# Patient Record
Sex: Male | Born: 1960 | Race: White | Hispanic: No | Marital: Married | Smoking: Never smoker
Health system: Southern US, Community
[De-identification: ages and names within clinical notes are randomized; demographics above are authoritative.]

## PROBLEM LIST (undated history)

## (undated) DIAGNOSIS — I480 Paroxysmal atrial fibrillation: Secondary | ICD-10-CM

## (undated) DIAGNOSIS — E785 Hyperlipidemia, unspecified: Secondary | ICD-10-CM

## (undated) DIAGNOSIS — I4892 Unspecified atrial flutter: Secondary | ICD-10-CM

## (undated) DIAGNOSIS — J309 Allergic rhinitis, unspecified: Secondary | ICD-10-CM

## (undated) DIAGNOSIS — R55 Syncope and collapse: Secondary | ICD-10-CM

## (undated) DIAGNOSIS — M1990 Unspecified osteoarthritis, unspecified site: Secondary | ICD-10-CM

## (undated) DIAGNOSIS — I493 Ventricular premature depolarization: Secondary | ICD-10-CM

## (undated) DIAGNOSIS — I519 Heart disease, unspecified: Secondary | ICD-10-CM

## (undated) DIAGNOSIS — G4730 Sleep apnea, unspecified: Secondary | ICD-10-CM

## (undated) DIAGNOSIS — K649 Unspecified hemorrhoids: Secondary | ICD-10-CM

## (undated) DIAGNOSIS — G510 Bell's palsy: Secondary | ICD-10-CM

## (undated) DIAGNOSIS — N400 Enlarged prostate without lower urinary tract symptoms: Secondary | ICD-10-CM

## (undated) HISTORY — DX: Syncope and collapse: R55

## (undated) HISTORY — DX: Benign prostatic hyperplasia without lower urinary tract symptoms: N40.0

## (undated) HISTORY — DX: Hyperlipidemia, unspecified: E78.5

## (undated) HISTORY — DX: Unspecified atrial flutter: I48.92

## (undated) HISTORY — DX: Allergic rhinitis, unspecified: J30.9

## (undated) HISTORY — DX: Ventricular premature depolarization: I49.3

## (undated) HISTORY — DX: Unspecified hemorrhoids: K64.9

## (undated) HISTORY — DX: Bell's palsy: G51.0

---
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
## 1997-03-20 HISTORY — PX: KNEE SURGERY: SHX244

## 1999-04-28 ENCOUNTER — Encounter: Admission: RE | Admit: 1999-04-28 | Discharge: 1999-04-28 | Payer: Self-pay | Admitting: Family Medicine

## 1999-04-28 ENCOUNTER — Encounter: Payer: Self-pay | Admitting: Family Medicine

## 1999-06-21 ENCOUNTER — Encounter: Payer: Self-pay | Admitting: Orthopedic Surgery

## 1999-06-21 ENCOUNTER — Encounter: Admission: RE | Admit: 1999-06-21 | Discharge: 1999-06-21 | Payer: Self-pay | Admitting: Orthopedic Surgery

## 2005-03-20 HISTORY — PX: NM RENAL LASIX (ARMC HX): HXRAD1213

## 2013-09-02 ENCOUNTER — Other Ambulatory Visit: Payer: Self-pay | Admitting: Urology

## 2013-09-02 DIAGNOSIS — R972 Elevated prostate specific antigen [PSA]: Secondary | ICD-10-CM

## 2013-09-17 ENCOUNTER — Ambulatory Visit (HOSPITAL_COMMUNITY)
Admission: RE | Admit: 2013-09-17 | Discharge: 2013-09-17 | Disposition: A | Discharge status: Home / Self Care | Payer: Managed Care, Other (non HMO) | Source: Ambulatory Visit | Attending: Urology | Admitting: Urology

## 2013-09-17 DIAGNOSIS — R972 Elevated prostate specific antigen [PSA]: Secondary | ICD-10-CM | POA: Insufficient documentation

## 2013-09-17 DIAGNOSIS — N400 Enlarged prostate without lower urinary tract symptoms: Secondary | ICD-10-CM | POA: Insufficient documentation

## 2013-09-17 DIAGNOSIS — K4090 Unilateral inguinal hernia, without obstruction or gangrene, not specified as recurrent: Secondary | ICD-10-CM | POA: Insufficient documentation

## 2013-09-17 MED ORDER — GADOBENATE DIMEGLUMINE 529 MG/ML IV SOLN
20.0000 mL | Freq: Once | INTRAVENOUS | Status: AC | PRN
  Administered 2013-09-17: 20 mL via INTRAVENOUS

## 2016-01-01 DIAGNOSIS — N400 Enlarged prostate without lower urinary tract symptoms: Secondary | ICD-10-CM

## 2016-01-01 DIAGNOSIS — R55 Syncope and collapse: Secondary | ICD-10-CM | POA: Insufficient documentation

## 2016-01-01 DIAGNOSIS — E785 Hyperlipidemia, unspecified: Secondary | ICD-10-CM

## 2016-01-01 HISTORY — DX: Benign prostatic hyperplasia without lower urinary tract symptoms: N40.0

## 2016-01-01 HISTORY — DX: Hyperlipidemia, unspecified: E78.5

## 2016-01-02 DIAGNOSIS — I4892 Unspecified atrial flutter: Secondary | ICD-10-CM

## 2016-01-02 DIAGNOSIS — I493 Ventricular premature depolarization: Secondary | ICD-10-CM

## 2016-01-02 HISTORY — DX: Unspecified atrial flutter: I48.92

## 2016-01-02 HISTORY — DX: Ventricular premature depolarization: I49.3

## 2016-01-20 ENCOUNTER — Encounter: Payer: Self-pay | Admitting: *Deleted

## 2016-01-20 ENCOUNTER — Other Ambulatory Visit: Payer: Self-pay | Admitting: *Deleted

## 2016-01-20 DIAGNOSIS — K649 Unspecified hemorrhoids: Secondary | ICD-10-CM

## 2016-01-20 DIAGNOSIS — E7800 Pure hypercholesterolemia, unspecified: Secondary | ICD-10-CM | POA: Insufficient documentation

## 2016-01-20 DIAGNOSIS — J309 Allergic rhinitis, unspecified: Secondary | ICD-10-CM | POA: Insufficient documentation

## 2016-01-20 HISTORY — DX: Unspecified hemorrhoids: K64.9

## 2016-01-20 HISTORY — DX: Allergic rhinitis, unspecified: J30.9

## 2016-02-04 ENCOUNTER — Institutional Professional Consult (permissible substitution): Payer: Managed Care, Other (non HMO) | Admitting: Internal Medicine

## 2016-02-04 ENCOUNTER — Ambulatory Visit (INDEPENDENT_AMBULATORY_CARE_PROVIDER_SITE_OTHER): Payer: Managed Care, Other (non HMO) | Admitting: Internal Medicine

## 2016-02-04 ENCOUNTER — Encounter: Payer: Self-pay | Admitting: Internal Medicine

## 2016-02-04 VITALS — BP 112/80 | HR 77 | Ht 77.0 in | Wt 235.0 lb

## 2016-02-04 DIAGNOSIS — Z7689 Persons encountering health services in other specified circumstances: Secondary | ICD-10-CM

## 2016-02-04 MED ORDER — FLECAINIDE ACETATE 100 MG PO TABS: 100.0000 mg | ORAL_TABLET | Freq: Two times a day (BID) | ORAL | 3 refills | Status: DC

## 2016-02-04 NOTE — Patient Instructions (Addendum)
Medication Instructions:  Your physician has recommended you make the following change in your medication:  1) START Flecainide 100 mg twice a day   Labwork: None Ordered   Testing/Procedures: EKG nurse visit in 10 days   Follow-Up: Your physician recommends that you schedule a follow-up appointment in: 1st week in January 2018 with Dr. Ladona Ridgel    Any Other Special Instructions Will Be Listed Below (If Applicable).     If you need a refill on your cardiac medications before your next appointment, please call your pharmacy.

## 2016-02-04 NOTE — Progress Notes (Signed)
HPI [name] is referred today by Dr. Jacinto Thompson for evaluation of atrial flutter. He is a pleasant middle aged man who has been healthy. He felt a little dizzy and sob when visiting his family in [place] and was taken to the ER and found to have atrial flutter. He was seen by Dr. Jacinto Thompson and had a normal echo and stress test. He does not feel palpitations per say but does note that when he walks uphill for several hundred yards gets sob with exertion. No syncope.  Allergies  Allergen Reactions  . Atorvastatin     Other reaction(s): weakness     Current Outpatient Prescriptions  Medication Sig Dispense Refill  . aspirin EC 81 MG tablet Take 81 mg by mouth daily.    . Cholecalciferol (D3 SUPER STRENGTH) 2000 units CAPS Take 2,000 Units by mouth daily.    . finasteride (PROSCAR) 5 MG tablet Take 5 mg by mouth daily.    . hydrocortisone-pramoxine (PROCTOFOAM HC) rectal foam Place 1 application rectally as needed.    . metoprolol tartrate (LOPRESSOR) 25 MG tablet Take 25 mg by mouth 2 (two) times daily.  1  . simvastatin (ZOCOR) 40 MG tablet Take 40 mg by mouth daily at 6 PM.     . vitamin E 400 UNIT capsule Take 400 Units by mouth daily.    . flecainide (TAMBOCOR) 100 MG tablet Take 1 tablet (100 mg total) by mouth 2 (two) times daily. 60 tablet 3   No current facility-administered medications for this visit.      Past Medical History:  Diagnosis Date  . Allergic rhinitis 01/20/2016  . Bell's palsy   . BPH (benign prostatic hyperplasia) 01/01/2016  . Dyslipidemia (high LDL; low HDL) 01/01/2016  . 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 Hemorrhoids 01/20/2016  . Irregular heartbeat   . Near syncope 01/01/2016  . Paroxysmal atrial flutter (HCC) 01/02/2016  . Pure hypercholesterolemia 01/20/2016    ROS:   All systems reviewed and negative except as noted in the HPI.   Past Surgical History:  Procedure Laterality Date  . APPENDECTOMY  1973  . KNEE SURGERY Right 1999     Family  History  Problem Relation Age of Onset  . CAD Father     quad bypass @ 6568  . Healthy Sister      Social History   Social History  . Marital status: Married    Spouse name: N/A  . Number of children: 1  . Years of education: N/A   Occupational History  . Not on file.   Social History Main Topics  . Smoking status: Never Smoker  . Smokeless tobacco: Never Used  . Alcohol use Yes     Comment: occasional  . Drug use: Unknown  . Sexual activity: Not on file   Other Topics Concern  . Not on file   Social History Narrative  . No narrative on file     BP 112/80   Pulse 77   Ht 6\' 5"  (1.956 m)   Wt 235 lb (106.6 kg)   SpO2 98%   BMI 27.87 kg/m   Physical Exam:  Well appearing NAD HEENT: Unremarkable Neck:  No JVD, no thyromegally Lymphatics:  No adenopathy Back:  No CVA tenderness Lungs:  Clear with no wheezes HEART:  Regular rate rhythm, no murmurs, no rubs, no clicks Abd:  soft, positive bowel sounds, no organomegally, no rebound, no guarding Ext:  2 plus pulses, no edema, no cyanosis,  no clubbing Skin:  No rashes no nodules Neuro:  CN II through XII intact, motor grossly intact  EKG - atrial fib and NSR on same ECG  Assess/Plan: 1. PAF - he has atrial fib in our office and reported to have atrial flutter (I do not have these tracings). I have discussed the treatment options from rate control to rhythm control. He would like to try rhythm control. Will start flecainide 100 mg bid. He will continue his beta blocker and return in 2 weeks for a repeat ECG to look at the QRS duration. If he is ok we will see him again and plan a stress test. 2. Dyslipidemia - continue statin therapy 3. Pericarditis - he notes that he had this about 40 years ago but none since.  Fred Thompson.D.

## 2016-04-04 ENCOUNTER — Ambulatory Visit (INDEPENDENT_AMBULATORY_CARE_PROVIDER_SITE_OTHER): Payer: Managed Care, Other (non HMO) | Admitting: Internal Medicine

## 2016-04-04 ENCOUNTER — Encounter: Payer: Self-pay | Admitting: Internal Medicine

## 2016-04-04 ENCOUNTER — Encounter (INDEPENDENT_AMBULATORY_CARE_PROVIDER_SITE_OTHER): Payer: Self-pay

## 2016-04-04 VITALS — BP 110/70 | HR 65 | Ht 77.0 in | Wt 236.0 lb

## 2016-04-04 DIAGNOSIS — I480 Paroxysmal atrial fibrillation: Secondary | ICD-10-CM

## 2016-04-04 NOTE — Progress Notes (Signed)
HPI [name] returns today for ongoing followup of atrial fib and flutter. He was seen by me 2 months ago and was placed on flecainide. He went back to rhythm spontaneously. He feels well except for a little fatigue on metoprolol. He does not have palpitations. No syncope. He can walk without limitation. Allergies  Allergen Reactions  . Atorvastatin     Other reaction(s): weakness     Current Outpatient Prescriptions  Medication Sig Dispense Refill  . aspirin EC 81 MG tablet Take 81 mg by mouth daily.    . Cholecalciferol (D3 SUPER STRENGTH) 2000 units CAPS Take 2,000 Units by mouth daily.    . finasteride (PROSCAR) 5 MG tablet Take 5 mg by mouth daily.    . flecainide (TAMBOCOR) 100 MG tablet Take 1 tablet (100 mg total) by mouth 2 (two) times daily. 60 tablet 3  . hydrocortisone-pramoxine (PROCTOFOAM HC) rectal foam Place 1 application rectally as needed.    . metoprolol tartrate (LOPRESSOR) 25 MG tablet Take 12.5 mg by mouth 2 (two) times daily.   1  . simvastatin (ZOCOR) 40 MG tablet Take 40 mg by mouth daily at 6 PM.     . vitamin E 400 UNIT capsule Take 400 Units by mouth daily.     No current facility-administered medications for this visit.      Past Medical History:  Diagnosis Date  . Allergic rhinitis 01/20/2016  . Bell's palsy   . BPH (benign prostatic hyperplasia) 01/01/2016  . Dyslipidemia (high LDL; low HDL) 01/01/2016  . 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 Hemorrhoids 01/20/2016  . Irregular heartbeat   . Near syncope 01/01/2016  . Paroxysmal atrial flutter (HCC) 01/02/2016  . Pure hypercholesterolemia 01/20/2016    ROS:   All systems reviewed and negative except as noted in the HPI.   Past Surgical History:  Procedure Laterality Date  . APPENDECTOMY  1973  . KNEE SURGERY Right 1999     Family History  Problem Relation Age of Onset  . CAD Father     quad bypass @ 12  . Healthy Sister      Social History   Social History  . Marital status:  Married    Spouse name: N/A  . Number of children: 1  . Years of education: N/A   Occupational History  . Not on file.   Social History Main Topics  . Smoking status: Never Smoker  . Smokeless tobacco: Never Used  . Alcohol use Yes     Comment: occasional  . Drug use: Unknown  . Sexual activity: Not on file   Other Topics Concern  . Not on file   Social History Narrative  . No narrative on file     BP 110/70   Pulse 65   Ht 6\' 5"  (1.956 m)   Wt 236 lb (107 kg)   SpO2 95%   BMI 27.99 kg/m   Physical Exam:  Well appearing middle aged man, NAD HEENT: Unremarkable Neck:  6 cm JVD, no thyromegally Lymphatics:  No adenopathy Back:  No CVA tenderness Lungs:  Clear with no wheezes HEART:  Regular rate rhythm, no murmurs, no rubs, no clicks Abd:  soft, positive bowel sounds, no organomegally, no rebound, no guarding Ext:  2 plus pulses, no edema, no cyanosis, no clubbing Skin:  No rashes no nodules Neuro:  CN II through XII intact, motor grossly intact  EKG - nsr with pvcs  Assess/Plan: 1. Atrial fib/flutter - He  is maintaining NSR. He will continue the flecainide and low dose beta blocker. Will have Dr. Jacinto Halim obtain an exercise stress test on flecainide. 2. PVC's - he will continue low dose flecainide. 3. Mild LV dysfunction - his echo EF was obtained in atrial fib. I would suggest repeating this in 6-12 months. Continue beta blocker. I'll defer the need to ARB/ACE to Dr. Jacinto Halim.  Leonia Reeves.D.

## 2016-04-04 NOTE — Patient Instructions (Addendum)
Medication Instructions:    Your physician recommends that you continue on your current medications as directed. Please refer to the Current Medication list given to you today.  --- If you need a refill on your cardiac medications before your next appointment, please call your pharmacy. ---  Labwork:  None ordered  Testing/Procedures: None ordered  Follow-Up:  Your physician wants you to follow-up in: 6 months with Dr. Ladona Ridgel.  You will receive a reminder letter in the mail two months in advance. If you don't receive a letter, please call our office to schedule the follow-up appointment.  Thank you for choosing [facility]!!     Any Other Special Instructions Will Be Listed Below (If Applicable).  Dr. Ladona Ridgel will get in touch with Dr. Verl Dicker office to arrange treadmill testing.  This is to evaluate your hear while on Flecainide.

## 2016-04-13 ENCOUNTER — Telehealth: Payer: Self-pay | Admitting: Internal Medicine

## 2016-04-13 NOTE — Telephone Encounter (Signed)
New Message   Patient has a appt Feb. 26 with Dr. Gillermina Hu.. Per pt was told by Dr. Ladona Ridgel that he would reach out to Dr. Gillermina Hu and request getting stress test done while he was there. Pt requesting call back.

## 2016-04-25 ENCOUNTER — Telehealth: Payer: Self-pay | Admitting: Internal Medicine

## 2016-04-25 DIAGNOSIS — Z79899 Other long term (current) drug therapy: Secondary | ICD-10-CM

## 2016-04-25 NOTE — Telephone Encounter (Signed)
New Message     Dr Jacinto Halim wants a referral from Dr Ladona Ridgel for pt to have Stress test before 05/15/16

## 2016-04-28 NOTE — Telephone Encounter (Signed)
Called, spoke with pt. Informed pt is scheduled for a GXT at our office on 05/15/16 at 10:30 AM, arriving at 10:15. Reviewed pt instructions for stress test. Pt needs to HOLD Metoprolol the morning prior to the test. Take all other medications. Pt verbalized understanding.

## 2016-05-15 ENCOUNTER — Ambulatory Visit (INDEPENDENT_AMBULATORY_CARE_PROVIDER_SITE_OTHER): Payer: Managed Care, Other (non HMO)

## 2016-05-15 ENCOUNTER — Other Ambulatory Visit: Payer: Self-pay | Admitting: Internal Medicine

## 2016-05-15 DIAGNOSIS — Z5181 Encounter for therapeutic drug level monitoring: Secondary | ICD-10-CM

## 2016-05-15 DIAGNOSIS — Z79899 Other long term (current) drug therapy: Secondary | ICD-10-CM

## 2016-05-15 DIAGNOSIS — I493 Ventricular premature depolarization: Secondary | ICD-10-CM | POA: Diagnosis not present

## 2016-05-15 DIAGNOSIS — I480 Paroxysmal atrial fibrillation: Secondary | ICD-10-CM | POA: Diagnosis not present

## 2016-05-15 LAB — EXERCISE TOLERANCE TEST
CHL CUP STRESS STAGE 1 DBP: 81 mmHg
CHL CUP STRESS STAGE 1 GRADE: 0 %
CHL CUP STRESS STAGE 1 HR: 74 {beats}/min
CHL CUP STRESS STAGE 1 SBP: 118 mmHg
CHL CUP STRESS STAGE 1 SPEED: 0 mph
CHL CUP STRESS STAGE 10 DBP: 71 mmHg
CHL CUP STRESS STAGE 10 GRADE: 0 %
CHL CUP STRESS STAGE 10 SBP: 125 mmHg
CHL CUP STRESS STAGE 10 SPEED: 0 mph
CHL CUP STRESS STAGE 2 HR: 74 {beats}/min
CHL CUP STRESS STAGE 3 HR: 72 {beats}/min
CHL CUP STRESS STAGE 4 SBP: 140 mmHg
CHL CUP STRESS STAGE 4 SPEED: 1.7 mph
CHL CUP STRESS STAGE 5 GRADE: 12 %
CHL CUP STRESS STAGE 5 HR: 112 {beats}/min
CHL CUP STRESS STAGE 6 DBP: 82 mmHg
CHL CUP STRESS STAGE 6 GRADE: 14 %
CHL CUP STRESS STAGE 6 HR: 133 {beats}/min
CHL CUP STRESS STAGE 6 SBP: 209 mmHg
CHL CUP STRESS STAGE 6 SPEED: 3.4 mph
CHL CUP STRESS STAGE 7 GRADE: 16 %
CHL CUP STRESS STAGE 9 GRADE: 0 %
CHL CUP STRESS STAGE 9 HR: 105 {beats}/min
Estimated workload: 10.5 METS
Exercise duration (min): 9 min
Exercise duration (sec): 15 s
MPHR: 163 {beats}/min
Peak HR: 134 {beats}/min
Percent HR: 82 %
Percent of predicted max HR: 82 %
RPE: 19
Rest HR: 68 {beats}/min
Stage 10 HR: 79 {beats}/min
Stage 2 Grade: 0 %
Stage 2 Speed: 1 mph
Stage 3 Grade: 0.1 %
Stage 3 Speed: 1 mph
Stage 4 DBP: 79 mmHg
Stage 4 Grade: 10 %
Stage 4 HR: 95 {beats}/min
Stage 5 DBP: 80 mmHg
Stage 5 SBP: 165 mmHg
Stage 5 Speed: 2.5 mph
Stage 7 HR: 134 {beats}/min
Stage 7 Speed: 4.2 mph
Stage 8 DBP: 58 mmHg
Stage 8 Grade: 0 %
Stage 8 HR: 116 {beats}/min
Stage 8 SBP: 135 mmHg
Stage 8 Speed: 1.5 mph
Stage 9 DBP: 58 mmHg
Stage 9 SBP: 135 mmHg
Stage 9 Speed: 1.5 mph

## 2016-05-24 ENCOUNTER — Telehealth: Payer: Self-pay

## 2016-05-24 NOTE — Telephone Encounter (Signed)
Rec'd faxed med list from Dr. Verl Dicker office. Diltiazem CD 180 mg cap daily (d/c'd Metoprolol Succinate). Flecainide 75 mg twice daily (med decreased) . Lipitor 20 mg (switched from Simvastin due to interaction with Diltiazem). Will forward to Dr. Ladona Ridgel to advise.

## 2016-05-24 NOTE — Telephone Encounter (Signed)
Called, spoke with pt. Reviewed results of recent stress test. Dr. Ladona Ridgel stated informed low risk stress test and for pt to continue with current medications. Pt informed Dr. Jacinto Halim made medication changes yesterday. Pt stated he is unsure of med changes (because he does not have his med bottles with him). Pt thinks his Flecainide was decreased to 75 mg twice daily. Pt stated Metoprolol was d/c'd. Informed I will contact Dr. Verl Dicker office to get current med list faxed. I will forward med changes to Dr. Ladona Ridgel to review. Pt verbalized understanding.

## 2016-05-29 ENCOUNTER — Other Ambulatory Visit: Payer: Self-pay | Admitting: Internal Medicine

## 2016-08-09 ENCOUNTER — Ambulatory Visit (INDEPENDENT_AMBULATORY_CARE_PROVIDER_SITE_OTHER): Payer: Managed Care, Other (non HMO) | Admitting: Neurology

## 2016-08-09 ENCOUNTER — Encounter (INDEPENDENT_AMBULATORY_CARE_PROVIDER_SITE_OTHER): Payer: Self-pay

## 2016-08-09 ENCOUNTER — Encounter: Payer: Self-pay | Admitting: Neurology

## 2016-08-09 VITALS — BP 146/90 | HR 86 | Resp 20 | Ht 77.0 in | Wt 239.0 lb

## 2016-08-09 DIAGNOSIS — T466X5A Adverse effect of antihyperlipidemic and antiarteriosclerotic drugs, initial encounter: Secondary | ICD-10-CM

## 2016-08-09 DIAGNOSIS — M791 Myalgia, unspecified site: Secondary | ICD-10-CM

## 2016-08-09 DIAGNOSIS — R0683 Snoring: Secondary | ICD-10-CM | POA: Diagnosis not present

## 2016-08-09 DIAGNOSIS — I5032 Chronic diastolic (congestive) heart failure: Secondary | ICD-10-CM

## 2016-08-09 DIAGNOSIS — I4892 Unspecified atrial flutter: Secondary | ICD-10-CM

## 2016-08-09 NOTE — Progress Notes (Signed)
SLEEP MEDICINE CLINIC   Provider:  Melvyn Novas, [place] D  Primary Care Physician:  [name], MD [facility].    Referring Provider: Dr Jacinto Halim.  Chief Complaint  Patient presents with  . New Patient (Initial Visit)    snoring, never had a sleep study    HPI:  Fred Thompson is a 56 y.o. male , seen here as in a referral  from Dr. Jacinto Halim to evaluate if  OSA could be present in this patient with atrial fib/ flutter.    I have the pleasure of seeing [name] today on 08/09/2016, referred by his cardiologist with an underlying diagnosis of atrial fibrillation and atrial flutter.  The patient reports snoring, rhinitis and has not been known to have apnea.  The patient has a past medical history of Bell's palsy on the left, had frequent PVCs, an echocardiogram from 01/18/2016 showed normal left ventricle size mild decrease in systolic function and mild global hypokinesis. Ejection fraction is 45-50% with a normal diastolic filling pattern the left atrium is mildly dilated. He has a trace of mitral regurgitation and a trace of tricuspid regurgitation as well. Paroxysmal atrial flutter was diagnosed in October 2017 and followed up with an exercise tolerance stress test on 05/15/2016, he achieved only 82% of PMH. At the time of the exercise tolerance stress test the patient had been taken generic Lipitor and he developed myositis, aching and was unable to exercise. He states that he was even unable to shrug his shoulders or walk a flight of stairs. There were  PVCs bigemini's-  with stress he had been clearly in atrial fibrillation in November 2017, when he was seen by Dr. Sharrell Ku. He has an incomplete right bundle branch block. Paroxysmal episodes of atrial flutter with variable atrioventricular conduction and rapid ventricular response with a diagnosis. The follow-up stress test was done as a nuclear study and reportedly normal.    Chief complaint according to patient : The  patient reported that he feels tired and fatigued, and attributes this mainly to the heart medication rather than the irregular heart rhythm. He also has nocturia up to 3 times at night.  Sleep habits are as follows: The patient goes to bed by 9:30 PM, his wife prefers to watch the TV on but he turns the volume down. It seems not to affect his sleep that the TV is in the room. The bedroom is otherwise cool and quiet. He sleeps on his side. He sleeps on one pillow only. He uses a flat mattress top. His sleep will be interrupted 2-3 times at night by the urge to urinate. He rises at 5:00 AM. He has an over 1 hour commute every morning to [place], [place]. He denies any sleep choking, shortness of breath, diaphoresis or palpitations. He wakes without headaches. He feels usually not ready to start his day and not restored or refreshed but has always done so. In total he will gets about 6- 7 hours of sleep at night.  Sleep medical history and family sleep history:  No family History of OSA, he was a sleep walker.   Social history: The patient is married with a 51 year old daughter, 2 grandchildren. Lives [place]. He commutes long distance. Not a shift worker. He has no history of tobacco use, he drinks less than 3 alcoholic beverages per week. He does not drink coffee tea and he does not drink soft drinks. No caffeine intake. 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  Review of Systems: Out of a complete 14 system review, the patient complains of only the following symptoms, and all other reviewed systems are negative. How likely are you to doze in the following situations: 0 = not likely, 1 = slight chance, 2 = moderate chance, 3 = high chance  Sitting and Reading? 2 Watching Television?2 Sitting inactive in a public place (theater or meeting)?2 As a passenger in a car for an hour without a break?2  Lying down in the afternoon when circumstances permit?3 Sitting and talking to someone?1 Sitting  quietly after lunch without alcohol?2 In a car, while stopped for a few minutes in traffic?1  Total =15    Epworth score 15- , Fatigue severity score 47  , depression score 1/ 15   Social History   Social History  . Marital status: Married    Spouse name: N/A  . Number of children: 1  . Years of education: N/A   Occupational History  . Not on file.   Social History Main Topics  . Smoking status: Never Smoker  . Smokeless tobacco: Never Used  . Alcohol use Yes     Comment: occasional  . Drug use: Unknown  . Sexual activity: Not on file   Other Topics Concern  . Not on file   Social History Narrative  . No narrative on file    Family History  Problem Relation Age of Onset  . CAD Father        quad bypass @ 49  . Healthy Sister     Past Medical History:  Diagnosis Date  . Allergic rhinitis 01/20/2016  . Bell's palsy   . BPH (benign prostatic hyperplasia) 01/01/2016  . Dyslipidemia (high LDL; low HDL) 01/01/2016  . 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 Hemorrhoids 01/20/2016  . Irregular heartbeat   . Near syncope 01/01/2016  . Paroxysmal atrial flutter (HCC) 01/02/2016  . Pure hypercholesterolemia 01/20/2016    Past Surgical History:  Procedure Laterality Date  . APPENDECTOMY  1973  . KNEE SURGERY Right 1999    Current Outpatient Prescriptions  Medication Sig Dispense Refill  . aspirin EC 81 MG tablet Take 81 mg by mouth daily.    . Cholecalciferol (D3 SUPER STRENGTH) 2000 units CAPS Take 2,000 Units by mouth daily.    Marland Kitchen diltiazem (DILACOR XR) 180 MG 24 hr capsule Take 180 mg by mouth daily.    . finasteride (PROSCAR) 5 MG tablet Take 5 mg by mouth daily.    . flecainide (TAMBOCOR) 150 MG tablet Take 75 mg by mouth 2 (two) times daily.  1  . hydrocortisone-pramoxine (PROCTOFOAM HC) rectal foam Place 1 application rectally as needed.    . vitamin E 400 UNIT capsule Take 400 Units by mouth daily.     No current facility-administered medications for this visit.       Allergies as of 08/09/2016 - Review Complete 08/09/2016  Allergen Reaction Noted  . Atorvastatin  07/12/2010    Vitals: BP (!) 146/90   Pulse 86   Resp 20   Ht 6\' 5"  (1.956 m)   Wt 239 lb (108.4 kg)   BMI 28.34 kg/m  Last Weight:  Wt Readings from Last 1 Encounters:  08/09/16 239 lb (108.4 kg)   JYN:WGNF mass index is 28.34 kg/m.     Last Height:   Ht Readings from Last 1 Encounters:  08/09/16 6\' 5"  (1.956 m)    Physical exam:  General: The patient is awake, alert and appears not in  acute distress. The patient is well groomed. Head: Normocephalic, atraumatic. Neck is supple. Mallampati 4,  neck circumference:17.5 . Nasal airflow rhinitis. TMJ is  Not evident . Retrognathia is seen.  Cardiovascular:  Regular rate and rhythm, without  murmurs or carotid bruit, and without distended neck veins. Respiratory: Lungs are clear to auscultation. Skin:  Without evidence of edema, or rash Trunk: BMI is normal . The patient's posture is erect   Neurologic exam : The patient is awake and alert, oriented to place and time.   Memory subjective  described as intact.  Attention span & concentration ability appears normal.  Speech is fluent,  without dysarthria, dysphonia or aphasia.  Mood and affect are appropriate.  Cranial nerves: Pupils are equal and briskly reactive to light. Funduscopic exam without evidence of pallor or edema.  Extraocular movements  in vertical and horizontal planes intact and without nystagmus. Visual fields by finger perimetry are intact. Hearing to finger rub intact.   Facial sensation intact to fine touch.  Facial motor strength is asymmetric, with a mild left Bell's residue - but  tongue and uvula move midline. Shoulder shrug was symmetrical.   Motor exam:   Normal tone, muscle bulk and symmetric strength in all extremities. Sensory:  normal. Coordination: Finger-to-nose maneuver  normal without evidence of ataxia, dysmetria or tremor.  Gait and  station: Patient walks without assistive device and is able unassisted to climb up to the exam table. Strength within normal limits.  Stance is stable and normal.  Turns with 3 Steps.   Deep tendon reflexes: in the  upper and lower extremities are symmetric and intact.   Assessment:    Dear Dr. Jacinto Halim,   After physical and neurologic examination, review of laboratory studies,   testing and pre-existing records as far as provided in visit., my assessment is   1) Mr. Konicek cardiac condition and irregular heartbeat with rapid ventricular responses may correlate to the presence of sleep apnea, also unbeknownst to him. I will order a split night polysomnography in the lab, and parallel order a home sleep test as well. I am less suspicious that the patient may suffer from hypoxemia, as is neither morbidly obese nor does he have normal muscle tone. He also does not report waking up with morning headaches.  The patient was advised of the nature of the diagnosed disorder , the treatment options and the  risks for general health and wellness arising from not treating the condition.   I spent more than 45 minutes of face to face time with the patient.  Greater than 50% of time was spent in counseling and coordination of care. We have discussed the diagnosis and differential and I answered the patient's questions.    Plan:  Treatment plan and additional workup : [employer] HST , we will meet after the home sleep test has been interpreted, and discuss further treatment options either by phone or in person on revisit.    Melvyn Novas, MD 08/09/2016, 9:20 AM  Certified in Neurology by ABPN Certified in Sleep Medicine by [facility] [address], [address] [place], [place] [address]

## 2016-08-28 ENCOUNTER — Ambulatory Visit (INDEPENDENT_AMBULATORY_CARE_PROVIDER_SITE_OTHER): Payer: Managed Care, Other (non HMO) | Admitting: Cardiology

## 2016-08-28 ENCOUNTER — Other Ambulatory Visit: Payer: Self-pay | Admitting: Cardiology

## 2016-08-28 ENCOUNTER — Encounter: Payer: Self-pay | Admitting: Cardiology

## 2016-08-28 ENCOUNTER — Encounter: Payer: Self-pay | Admitting: *Deleted

## 2016-08-28 VITALS — BP 130/76 | HR 77 | Ht 77.0 in | Wt 237.8 lb

## 2016-08-28 DIAGNOSIS — I480 Paroxysmal atrial fibrillation: Secondary | ICD-10-CM | POA: Diagnosis not present

## 2016-08-28 DIAGNOSIS — I428 Other cardiomyopathies: Secondary | ICD-10-CM

## 2016-08-28 DIAGNOSIS — I493 Ventricular premature depolarization: Secondary | ICD-10-CM | POA: Diagnosis not present

## 2016-08-28 NOTE — Patient Instructions (Addendum)
Medication Instructions:    Your physician recommends that you continue on your current medications as directed. Please refer to the Current Medication list given to you today.  - If you need a refill on your cardiac medications before your next appointment, please call your pharmacy.   Labwork:  None ordered  Testing/Procedures:  Your physician has requested that you have cardiac CT - you will have this performed within 7 days prior to ablation. Cardiac computed tomography (CT) is a painless test that uses an x-ray machine to take clear, detailed pictures of your heart. For further information please visit [email or web address]   The office will call you to arrange this test, after we have pre-certified with  insurance.  Your physician has recommended that you have an ablation. Catheter ablation is a medical procedure used to treat some cardiac arrhythmias (irregular heartbeats). During catheter ablation, a long, thin, flexible tube is put into a blood vessel in your groin (upper thigh), or neck. This tube is called an ablation catheter. It is then guided to your heart through the blood vessel. Radio frequency waves destroy small areas of heart tissue where abnormal heartbeats may cause an arrhythmia to start. Please see the instruction sheet given to you today.  Follow-Up:  Your physician recommends that you schedule a follow-up appointment in: 4 weeks, after your procedure on 09/29/16, with Fred Thompson in the AFib clinic.   Your physician recommends that you schedule a follow-up appointment in: 3 months, after your procedure on 09/29/16, with Fred Thompson.  Thank you for choosing [facility]!!   Dory Horn, RN [phone]  Any Other Special Instructions Will Be Listed Below (If Applicable).   Cardiac Ablation Cardiac ablation is a procedure to disable (ablate) a small amount of heart tissue in very specific places. The heart has many electrical connections. Sometimes these  connections are abnormal and can cause the heart to beat very fast or irregularly. Ablating some of the problem areas can improve the heart rhythm or return it to normal. Ablation may be done for people who:  Have Wolff-Parkinson-White syndrome.  Have fast heart rhythms (tachycardia).  Have taken medicines for an abnormal heart rhythm (arrhythmia) that were not effective or caused side effects.  Have a high-risk heartbeat that may be life-threatening.  During the procedure, a small incision is made in the neck or the groin, and a long, thin, flexible tube (catheter) is inserted into the incision and moved to the heart. Small devices (electrodes) on the tip of the catheter will send out electrical currents. A type of X-ray (fluoroscopy) will be used to help guide the catheter and to provide images of the heart. Tell a health care provider about:  Any allergies you have.  All medicines you are taking, including vitamins, herbs, eye drops, creams, and over-the-counter medicines.  Any problems you or family members have had with anesthetic medicines.  Any blood disorders you have.  Any surgeries you have had.  Any medical conditions you have, such as kidney failure.  Whether you are pregnant or may be pregnant. What are the risks? Generally, this is a safe procedure. However, problems may occur, including:  Infection.  Bruising and bleeding at the catheter insertion site.  Bleeding into the chest, especially into the sac that surrounds the heart. This is a serious complication.  Stroke or blood clots.  Damage to other structures or organs.  Allergic reaction to medicines or dyes.  Need for a permanent pacemaker if the normal electrical system  is damaged. A pacemaker is a small computer that sends electrical signals to the heart and helps your heart beat normally.  The procedure not being fully effective. This may not be recognized until months later. Repeat ablation procedures  are sometimes required.  What happens before the procedure?  Follow instructions from your health care provider about eating or drinking restrictions.  Ask your health care provider about: ? Changing or stopping your regular medicines. This is especially important if you are taking diabetes medicines or blood thinners. ? Taking medicines such as aspirin and ibuprofen. These medicines can thin your blood. Do not take these medicines before your procedure if your health care provider instructs you not to.  Plan to have someone take you home from the hospital or clinic.  If you will be going home right after the procedure, plan to have someone with you for 24 hours. What happens during the procedure?  To lower your risk of infection: ? Your health care team will wash or sanitize their hands. ? Your skin will be washed with soap. ? Hair may be removed from the incision area.  An IV tube will be inserted into one of your veins.  You will be given a medicine to help you relax (sedative).  The skin on your neck or groin will be numbed.  An incision will be made in your neck or your groin.  A needle will be inserted through the incision and into a large vein in your neck or groin.  A catheter will be inserted into the needle and moved to your heart.  Dye may be injected through the catheter to help your surgeon see the area of the heart that needs treatment.  Electrical currents will be sent from the catheter to ablate heart tissue in desired areas. There are three types of energy that may be used to ablate heart tissue: ? Heat (radiofrequency energy). ? Laser energy. ? Extreme cold (cryoablation).  When the necessary tissue has been ablated, the catheter will be removed.  Pressure will be held on the catheter insertion area to prevent excessive bleeding.  A bandage (dressing) will be placed over the catheter insertion area. The procedure may vary among health care providers and  hospitals. What happens after the procedure?  Your blood pressure, heart rate, breathing rate, and blood oxygen level will be monitored until the medicines you were given have worn off.  Your catheter insertion area will be monitored for bleeding. You will need to lie still for a few hours to ensure that you do not bleed from the catheter insertion area.  Do not drive for 24 hours or as long as directed by your health care provider. Summary  Cardiac ablation is a procedure to disable (ablate) a small amount of heart tissue in very specific places. Ablating some of the problem areas can improve the heart rhythm or return it to normal.  During the procedure, electrical currents will be sent from the catheter to ablate heart tissue in desired areas. This information is not intended to replace advice given to you by your health care provider. Make sure you discuss any questions you have with your health care provider. Document Released: 07/23/2008 Document Revised: 01/24/2016 Document Reviewed: 01/24/2016 Elsevier Interactive Patient Education  [employer].

## 2016-08-28 NOTE — Progress Notes (Signed)
Electrophysiology Office Note   Date:  08/28/2016   ID:  CHEE KINSLOW, DOB [birth year], MRN [id number]  PCP:  [name], MD  Cardiologist:  Colan Neptune Primary Electrophysiologist:  Breda Bond Jorja Loa, MD    Chief Complaint  Patient presents with  . Follow-up    PAF/Flutter, discuss Afib ablation     History of Present Illness: Fred Thompson is a 56 y.o. male who is being seen today for the evaluation of atrial fibrillation/flutter at the request of [name], [name], MD. Presenting today for electrophysiology evaluation.  He was initially diagnosed with atrial fibrillation in October 2017. Initial symptoms were dizziness, diaphoresis, or syncope. He was a fundraiser time taking in the hospital was diagnosed. He was since placed on flecainide, and did not tolerate this medication due to fatigue. I Rowe Warman switch to multi, and he continues to have pain amounts of fatigue. He has had nuclear stress tests, which apparently been normal, and an echo showed an EF of 45-50%. He does continue to have episodes of fatigue. This past weekend, he did his granddaughter for lunch, and had to return home due to extreme amounts of fatigue.    Today, he denies symptoms of palpitations, chest pain, shortness of breath, orthopnea, PND, lower extremity edema, claudication, dizziness, presyncope, syncope, bleeding, or neurologic sequela. The patient is tolerating medications without difficulties. His only complaint today is of fatigue.   Past Medical History:  Diagnosis Date  . Allergic rhinitis 01/20/2016  . Bell's palsy   . BPH (benign prostatic hyperplasia) 01/01/2016  . Dyslipidemia (high LDL; low HDL) 01/01/2016  . 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 Hemorrhoids 01/20/2016  . Irregular heartbeat   . Near syncope 01/01/2016  . Paroxysmal atrial flutter (HCC) 01/02/2016  . Pure hypercholesterolemia 01/20/2016   Past Surgical History:  Procedure Laterality Date  . APPENDECTOMY  1973  . KNEE  SURGERY Right 1999     Current Outpatient Prescriptions  Medication Sig Dispense Refill  . Cholecalciferol (D3 SUPER STRENGTH) 2000 units CAPS Take 2,000 Units by mouth daily.    Marland Kitchen diltiazem (DILACOR XR) 180 MG 24 hr capsule Take 180 mg by mouth daily.    . finasteride (PROSCAR) 5 MG tablet Take 5 mg by mouth daily.    . hydrocortisone-pramoxine (PROCTOFOAM HC) rectal foam Place 1 application rectally as needed.    . MULTAQ 400 MG tablet Take 400 mg by mouth 2 (two) times daily.  2  . vitamin E 400 UNIT capsule Take 400 Units by mouth daily.    Carlena Hurl 20 MG TABS tablet Take 20 mg by mouth daily.  3   No current facility-administered medications for this visit.     Allergies:   Atorvastatin   Social History:  The patient  reports that he has never smoked. He has never used smokeless tobacco. He reports that he drinks alcohol.   Family History:  The patient's family history includes CAD in his father; Healthy in his sister.    ROS:  Please see the history of present illness.   Otherwise, review of systems is positive for fatigue, palpitations, DOE.   All other systems are reviewed and negative.    PHYSICAL EXAM: VS:  BP 130/76   Pulse 77   Ht 6\' 5"  (1.956 m)   Wt 237 lb 12.8 oz (107.9 kg)   BMI 28.20 kg/m  , BMI Body mass index is 28.2 kg/m. GEN: Well nourished, well developed, in no acute distress  HEENT: normal  Neck: no JVD, carotid bruits, or masses Cardiac: RRR; no murmurs, rubs, or gallops,no edema  Respiratory:  clear to auscultation bilaterally, normal work of breathing GI: soft, nontender, nondistended, + BS MS: no deformity or atrophy  Skin: warm and dry Neuro:  Strength and sensation are intact Psych: euthymic mood, full affect  EKG:  EKG is ordered today. Personal review of the ekg ordered shows sinus rhythm, rate 77  Recent Labs: No results found for requested labs within last 8760 hours.    Lipid Panel  No results found for: CHOL, TRIG, HDL,  CHOLHDL, VLDL, LDLCALC, LDLDIRECT   Wt Readings from Last 3 Encounters:  08/28/16 237 lb 12.8 oz (107.9 kg)  08/09/16 239 lb (108.4 kg)  04/04/16 236 lb (107 kg)      Other studies Reviewed: Additional studies/ records that were reviewed today include: ETT 05/15/16  Review of the above records today demonstrates:    Blood pressure demonstrated a hypertensive response to exercise. 1 mm upsloping ST depression in inferior leads with exercise Non diagnostic due to achieving onlyh 82% of PMHR PVC;s and bigemminy with stress HTN response to exercise   TTE 01/17/17 Mild global hypokinesis, EF 45-50%, mildly dilated left atrium 4.1 cm  ASSESSMENT AND PLAN:  1.  Atrial fibrillation/flutter: Currently on Multaq. He is complaining of fatigue with multi, and has not tolerated flecainide in the past. Discussed continuing medical management versus ablation. Risks and benefits of ablation were discussed. Risks include bleeding, tamponade, heart block, stroke, and damage to surrounding organs. He understands these risks and has agreed to the procedure. We Karinna Beadles continue his Xarelto until 3 months post procedure.  This patients CHA2DS2-VASc Score and unadjusted Ischemic Stroke Rate (% per year) is equal to 0.6 % stroke rate/year from a score of 1  Above score calculated as 1 point each if present [CHF, HTN, DM, Vascular=MI/PAD/Aortic Plaque, Age if 65-74, or Male] Above score calculated as 2 points each if present [Age > 75, or Stroke/TIA/TE]  2. PVCs: No PVCs on his EKG today. Continue current management.  3. Mild LV dysfunction: We'll defer adding ACE inhibitor/ARB to Dr. Jacinto Halim. Appears euvolemic on exam.  Current medicines are reviewed at length with the patient today.   The patient does not have concerns regarding his medicines.  The following changes were made today:  none  Labs/ tests ordered today include:  Orders Placed This Encounter  Procedures  . EKG 12-Lead      Disposition:   FU with Sandee Bernath 4 month  Signed, Eliya Bubar Jorja Loa, MD  08/28/2016 9:10 AM     [facility] [address] [place] [address] [phone] (office) [phone] (fax)

## 2016-08-28 NOTE — Addendum Note (Signed)
Addended by: Baird Lyons on: 08/28/2016 09:17 AM   Modules accepted: Orders

## 2016-09-12 ENCOUNTER — Encounter: Payer: Self-pay | Admitting: Cardiology

## 2016-09-13 ENCOUNTER — Ambulatory Visit (INDEPENDENT_AMBULATORY_CARE_PROVIDER_SITE_OTHER): Payer: Managed Care, Other (non HMO) | Admitting: Neurology

## 2016-09-13 DIAGNOSIS — I4892 Unspecified atrial flutter: Secondary | ICD-10-CM

## 2016-09-13 DIAGNOSIS — G4710 Hypersomnia, unspecified: Secondary | ICD-10-CM | POA: Diagnosis not present

## 2016-09-13 DIAGNOSIS — T466X5A Adverse effect of antihyperlipidemic and antiarteriosclerotic drugs, initial encounter: Secondary | ICD-10-CM

## 2016-09-13 DIAGNOSIS — M791 Myalgia, unspecified site: Secondary | ICD-10-CM

## 2016-09-13 DIAGNOSIS — G4737 Central sleep apnea in conditions classified elsewhere: Secondary | ICD-10-CM

## 2016-09-13 DIAGNOSIS — R0683 Snoring: Secondary | ICD-10-CM

## 2016-09-22 ENCOUNTER — Ambulatory Visit (HOSPITAL_COMMUNITY)
Admission: RE | Admit: 2016-09-22 | Discharge: 2016-09-22 | Disposition: A | Discharge status: Home / Self Care | Payer: Managed Care, Other (non HMO) | Source: Ambulatory Visit | Attending: Cardiology | Admitting: Cardiology

## 2016-09-22 ENCOUNTER — Ambulatory Visit (HOSPITAL_COMMUNITY): Admission: RE | Admit: 2016-09-22 | Payer: Managed Care, Other (non HMO) | Source: Ambulatory Visit

## 2016-09-22 DIAGNOSIS — I480 Paroxysmal atrial fibrillation: Secondary | ICD-10-CM | POA: Diagnosis not present

## 2016-09-22 DIAGNOSIS — I700 Atherosclerosis of aorta: Secondary | ICD-10-CM | POA: Diagnosis not present

## 2016-09-22 DIAGNOSIS — R079 Chest pain, unspecified: Secondary | ICD-10-CM | POA: Diagnosis not present

## 2016-09-22 DIAGNOSIS — I2510 Atherosclerotic heart disease of native coronary artery without angina pectoris: Secondary | ICD-10-CM | POA: Diagnosis not present

## 2016-09-22 DIAGNOSIS — K449 Diaphragmatic hernia without obstruction or gangrene: Secondary | ICD-10-CM | POA: Insufficient documentation

## 2016-09-22 MED ORDER — METOPROLOL TARTRATE 5 MG/5ML IV SOLN
5.0000 mg | INTRAVENOUS | Status: DC | PRN
  Administered 2016-09-22: 5 mg via INTRAVENOUS
  Filled 2016-09-22: qty 5

## 2016-09-22 MED ORDER — METOPROLOL TARTRATE 5 MG/5ML IV SOLN
INTRAVENOUS | Status: AC
  Filled 2016-09-22: qty 15

## 2016-09-22 MED ORDER — NITROGLYCERIN 0.4 MG SL SUBL
0.4000 mg | SUBLINGUAL_TABLET | SUBLINGUAL | Status: DC | PRN
  Administered 2016-09-22: 0.4 mg via SUBLINGUAL
  Filled 2016-09-22: qty 25

## 2016-09-22 MED ORDER — IOPAMIDOL (ISOVUE-370) INJECTION 76%
INTRAVENOUS | Status: DC
Start: 2016-09-22 — End: 2016-09-23
  Filled 2016-09-22: qty 100

## 2016-09-22 MED ORDER — NITROGLYCERIN 0.4 MG SL SUBL
SUBLINGUAL_TABLET | SUBLINGUAL | Status: AC
  Filled 2016-09-22: qty 1

## 2016-09-22 MED ORDER — IOPAMIDOL (ISOVUE-370) INJECTION 76%
INTRAVENOUS | Status: AC
  Administered 2016-09-22: 80 mL
  Filled 2016-09-22: qty 100

## 2016-09-22 MED ORDER — IOPAMIDOL (ISOVUE-370) INJECTION 76%
INTRAVENOUS | Status: AC
  Filled 2016-09-22: qty 50

## 2016-09-22 MED ORDER — IOPAMIDOL (ISOVUE-370) INJECTION 76%
INTRAVENOUS | Status: AC
  Filled 2016-09-22: qty 100

## 2016-09-22 MED ORDER — METOPROLOL TARTRATE 5 MG/5ML IV SOLN
INTRAVENOUS | Status: AC
  Filled 2016-09-22: qty 10

## 2016-09-26 ENCOUNTER — Other Ambulatory Visit: Payer: Self-pay | Admitting: Neurology

## 2016-09-26 DIAGNOSIS — I480 Paroxysmal atrial fibrillation: Secondary | ICD-10-CM

## 2016-09-26 DIAGNOSIS — G4719 Other hypersomnia: Secondary | ICD-10-CM

## 2016-09-26 DIAGNOSIS — I509 Heart failure, unspecified: Secondary | ICD-10-CM

## 2016-09-26 NOTE — Procedures (Signed)
NAME: Fred Thompson   DOB: [birth year] MEDICAL RECORD [id number]   DOS: 09/18/16      REFERRING PHYSICIAN: Yates Decamp, MD STUDY PERFORMED: HST HISTORY: [name] is a 56 year old male, seen here to evaluate if OSA could be present in this patient with atrial fib/ flutter. The patient reported that he feels tired and fatigued, and attributes this mainly to the heart medication rather than the irregular heart rhythm. He also has nocturia- up to 3 times at night. He denies any sleep choking, shortness of breath, diaphoresis or palpitations. He wakes without headaches, will get about 6- 7 hours of sleep at night. Epworth score 15/ 24 points, Fatigue severity score 47 points, depression score 01/ 15    STUDY RESULTS: Total Recording Time: 7h 13m Total Apnea/Hypopnea Index (AHI): 5.6 /hr. and RDI was 9.2/ hr. Average Oxygen Saturation: SpO2 93%. Lowest Oxygen Saturation: nadir SpO2 89%, total desaturation time was only 1 minute. Average Heart Rate: 63 bpm, between 40 and 107 bpm.   IMPRESSION: Mild sleep apnea. Moderate snoring with arousals (RDI). No associated hypoxemia.  RECOMMENDATION: CPAP treatment is optional for such a mild degree of apnea and lack of hypoxia. I recommend attended CPAP titration due to the finding of central apnea and variable heart rate , and due to the listed comorbidities (atrial fibrillation) and excessive daytime sleepiness.  I certify that I have reviewed the raw data recording prior to the issuance of this report in accordance with the standards of the American Academy of Sleep Medicine (AASM). Melvyn Novas, MD   09-26-2016  [facility]. Medical Director Diplomat, ABPN, ABSM.  Member of and accredited by AASM

## 2016-09-26 NOTE — Addendum Note (Signed)
Addended by: Melvyn Novas on: 09/26/2016 05:48 PM   Modules accepted: Orders

## 2016-09-28 ENCOUNTER — Telehealth: Payer: Self-pay | Admitting: Cardiology

## 2016-09-28 NOTE — Telephone Encounter (Signed)
New message    Pt is calling asking for a call back from RN. He has questions about his procedure scheduled for tomorrow.

## 2016-09-28 NOTE — Telephone Encounter (Signed)
Called patient back and went over instructions for his procedure tomorrow. Patient verbalized understanding.

## 2016-09-29 ENCOUNTER — Ambulatory Visit (HOSPITAL_COMMUNITY)
Admission: RE | Admit: 2016-09-29 | Discharge: 2016-09-30 | Disposition: A | Discharge status: Home / Self Care | Payer: Managed Care, Other (non HMO) | Source: Ambulatory Visit | Attending: Cardiology | Admitting: Cardiology

## 2016-09-29 ENCOUNTER — Encounter (HOSPITAL_COMMUNITY): Payer: Self-pay | Admitting: Certified Registered Nurse Anesthetist

## 2016-09-29 ENCOUNTER — Ambulatory Visit (HOSPITAL_COMMUNITY): Payer: Managed Care, Other (non HMO) | Admitting: Anesthesiology

## 2016-09-29 ENCOUNTER — Other Ambulatory Visit: Payer: Self-pay

## 2016-09-29 ENCOUNTER — Encounter (HOSPITAL_COMMUNITY)
Admission: RE | Disposition: A | Discharge status: Home / Self Care | Payer: Self-pay | Source: Ambulatory Visit | Attending: Cardiology

## 2016-09-29 DIAGNOSIS — E7800 Pure hypercholesterolemia, unspecified: Secondary | ICD-10-CM | POA: Diagnosis not present

## 2016-09-29 DIAGNOSIS — K449 Diaphragmatic hernia without obstruction or gangrene: Secondary | ICD-10-CM | POA: Insufficient documentation

## 2016-09-29 DIAGNOSIS — I2510 Atherosclerotic heart disease of native coronary artery without angina pectoris: Secondary | ICD-10-CM | POA: Diagnosis not present

## 2016-09-29 DIAGNOSIS — I480 Paroxysmal atrial fibrillation: Secondary | ICD-10-CM

## 2016-09-29 DIAGNOSIS — D72819 Decreased white blood cell count, unspecified: Secondary | ICD-10-CM

## 2016-09-29 DIAGNOSIS — I4891 Unspecified atrial fibrillation: Secondary | ICD-10-CM | POA: Diagnosis present

## 2016-09-29 DIAGNOSIS — I483 Typical atrial flutter: Secondary | ICD-10-CM

## 2016-09-29 DIAGNOSIS — I493 Ventricular premature depolarization: Secondary | ICD-10-CM | POA: Diagnosis present

## 2016-09-29 DIAGNOSIS — I4892 Unspecified atrial flutter: Secondary | ICD-10-CM | POA: Diagnosis present

## 2016-09-29 DIAGNOSIS — I700 Atherosclerosis of aorta: Secondary | ICD-10-CM | POA: Insufficient documentation

## 2016-09-29 DIAGNOSIS — E785 Hyperlipidemia, unspecified: Secondary | ICD-10-CM | POA: Diagnosis present

## 2016-09-29 DIAGNOSIS — N400 Enlarged prostate without lower urinary tract symptoms: Secondary | ICD-10-CM | POA: Diagnosis not present

## 2016-09-29 DIAGNOSIS — Z7901 Long term (current) use of anticoagulants: Secondary | ICD-10-CM | POA: Insufficient documentation

## 2016-09-29 DIAGNOSIS — D649 Anemia, unspecified: Secondary | ICD-10-CM | POA: Diagnosis not present

## 2016-09-29 HISTORY — DX: Heart disease, unspecified: I51.9

## 2016-09-29 HISTORY — PX: ATRIAL FIBRILLATION ABLATION: EP1191

## 2016-09-29 HISTORY — DX: Paroxysmal atrial fibrillation: I48.0

## 2016-09-29 LAB — BASIC METABOLIC PANEL
Anion gap: 6 (ref 5–15)
BUN: 10 mg/dL (ref 6–20)
CHLORIDE: 107 mmol/L (ref 101–111)
CO2: 24 mmol/L (ref 22–32)
CREATININE: 1 mg/dL (ref 0.61–1.24)
Calcium: 8.7 mg/dL — ABNORMAL LOW (ref 8.9–10.3)
Glucose, Bld: 98 mg/dL (ref 65–99)
POTASSIUM: 3.8 mmol/L (ref 3.5–5.1)
SODIUM: 137 mmol/L (ref 135–145)

## 2016-09-29 LAB — CBC
HCT: 37.2 % — ABNORMAL LOW (ref 39.0–52.0)
HEMOGLOBIN: 12.2 g/dL — AB (ref 13.0–17.0)
MCH: 27.7 pg (ref 26.0–34.0)
MCHC: 32.8 g/dL (ref 30.0–36.0)
MCV: 84.5 fL (ref 78.0–100.0)
PLATELETS: 187 10*3/uL (ref 150–400)
RBC: 4.4 MIL/uL (ref 4.22–5.81)
RDW: 13.2 % (ref 11.5–15.5)
WBC: 3.4 10*3/uL — AB (ref 4.0–10.5)

## 2016-09-29 LAB — POCT ACTIVATED CLOTTING TIME
ACTIVATED CLOTTING TIME: 252 s
ACTIVATED CLOTTING TIME: 296 s
ACTIVATED CLOTTING TIME: 197 s
Activated Clotting Time: 290 seconds
Activated Clotting Time: 213 seconds

## 2016-09-29 SURGERY — ATRIAL FIBRILLATION ABLATION
Anesthesia: General

## 2016-09-29 MED ORDER — SODIUM CHLORIDE 0.9 % IV SOLN: 250.0000 mL | INTRAVENOUS | Status: DC | PRN

## 2016-09-29 MED ORDER — HEPARIN SODIUM (PORCINE) 1000 UNIT/ML IJ SOLN
INTRAMUSCULAR | Status: DC | PRN
  Administered 2016-09-29: 4000 [IU] via INTRAVENOUS
  Administered 2016-09-29: 7000 [IU] via INTRAVENOUS
  Administered 2016-09-29: 15000 [IU] via INTRAVENOUS
  Administered 2016-09-29: 5000 [IU] via INTRAVENOUS

## 2016-09-29 MED ORDER — FINASTERIDE 5 MG PO TABS
5.0000 mg | ORAL_TABLET | Freq: Every day | ORAL | Status: DC
  Administered 2016-09-29 – 2016-09-30 (×2): 5 mg via ORAL
  Filled 2016-09-29 (×2): qty 1

## 2016-09-29 MED ORDER — LIDOCAINE 2% (20 MG/ML) 5 ML SYRINGE
INTRAMUSCULAR | Status: DC | PRN
  Administered 2016-09-29: 60 mg via INTRAVENOUS

## 2016-09-29 MED ORDER — ACETAMINOPHEN 325 MG PO TABS: 650.0000 mg | ORAL_TABLET | ORAL | Status: DC | PRN

## 2016-09-29 MED ORDER — HEPARIN (PORCINE) IN NACL 2-0.9 UNIT/ML-% IJ SOLN
INTRAMUSCULAR | Status: AC
  Filled 2016-09-29: qty 500

## 2016-09-29 MED ORDER — ONDANSETRON HCL 4 MG/2ML IJ SOLN
INTRAMUSCULAR | Status: DC | PRN
  Administered 2016-09-29: 4 mg via INTRAVENOUS

## 2016-09-29 MED ORDER — PROTAMINE SULFATE 10 MG/ML IV SOLN
INTRAVENOUS | Status: DC | PRN
  Administered 2016-09-29: 40 mg via INTRAVENOUS

## 2016-09-29 MED ORDER — DILTIAZEM HCL ER COATED BEADS 180 MG PO CP24
180.0000 mg | ORAL_CAPSULE | Freq: Every day | ORAL | Status: DC
  Administered 2016-09-29 – 2016-09-30 (×2): 180 mg via ORAL
  Filled 2016-09-29 (×4): qty 1

## 2016-09-29 MED ORDER — SODIUM CHLORIDE 0.9% FLUSH
3.0000 mL | Freq: Two times a day (BID) | INTRAVENOUS | Status: DC
  Administered 2016-09-29 – 2016-09-30 (×2): 3 mL via INTRAVENOUS

## 2016-09-29 MED ORDER — VITAMIN D 1000 UNITS PO TABS
2000.0000 [IU] | ORAL_TABLET | Freq: Every day | ORAL | Status: DC
  Administered 2016-09-29 – 2016-09-30 (×2): 2000 [IU] via ORAL
  Filled 2016-09-29 (×2): qty 2

## 2016-09-29 MED ORDER — BUPIVACAINE HCL (PF) 0.25 % IJ SOLN
INTRAMUSCULAR | Status: DC | PRN
  Administered 2016-09-29: 40 mL

## 2016-09-29 MED ORDER — RIVAROXABAN 20 MG PO TABS
20.0000 mg | ORAL_TABLET | Freq: Every day | ORAL | Status: DC
  Administered 2016-09-29: 20 mg via ORAL
  Filled 2016-09-29: qty 1

## 2016-09-29 MED ORDER — ONDANSETRON HCL 4 MG/2ML IJ SOLN: 4.0000 mg | Freq: Four times a day (QID) | INTRAMUSCULAR | Status: DC | PRN

## 2016-09-29 MED ORDER — HEPARIN SODIUM (PORCINE) 1000 UNIT/ML IJ SOLN
INTRAMUSCULAR | Status: AC
  Filled 2016-09-29: qty 1

## 2016-09-29 MED ORDER — ROCURONIUM BROMIDE 10 MG/ML (PF) SYRINGE
PREFILLED_SYRINGE | INTRAVENOUS | Status: DC | PRN
  Administered 2016-09-29: 20 mg via INTRAVENOUS
  Administered 2016-09-29: 50 mg via INTRAVENOUS

## 2016-09-29 MED ORDER — EPHEDRINE SULFATE-NACL 50-0.9 MG/10ML-% IV SOSY
PREFILLED_SYRINGE | INTRAVENOUS | Status: DC | PRN
  Administered 2016-09-29 (×3): 5 mg via INTRAVENOUS

## 2016-09-29 MED ORDER — VITAMIN E 180 MG (400 UNIT) PO CAPS
400.0000 [IU] | ORAL_CAPSULE | Freq: Every day | ORAL | Status: DC
  Administered 2016-09-30: 400 [IU] via ORAL
  Filled 2016-09-29 (×2): qty 1

## 2016-09-29 MED ORDER — LACTATED RINGERS IV SOLN
INTRAVENOUS | Status: DC | PRN
  Administered 2016-09-29 (×2): via INTRAVENOUS

## 2016-09-29 MED ORDER — BUPIVACAINE HCL (PF) 0.25 % IJ SOLN
INTRAMUSCULAR | Status: AC
  Filled 2016-09-29: qty 30

## 2016-09-29 MED ORDER — MIDAZOLAM HCL 2 MG/2ML IJ SOLN
INTRAMUSCULAR | Status: DC | PRN
  Administered 2016-09-29: 2 mg via INTRAVENOUS

## 2016-09-29 MED ORDER — HEPARIN SODIUM (PORCINE) 1000 UNIT/ML IJ SOLN
INTRAMUSCULAR | Status: DC | PRN
  Administered 2016-09-29: 1000 [IU] via INTRAVENOUS

## 2016-09-29 MED ORDER — PROPOFOL 10 MG/ML IV BOLUS
INTRAVENOUS | Status: DC | PRN
  Administered 2016-09-29: 160 mg via INTRAVENOUS
  Administered 2016-09-29: 40 mg via INTRAVENOUS

## 2016-09-29 MED ORDER — PHENYLEPHRINE 40 MCG/ML (10ML) SYRINGE FOR IV PUSH (FOR BLOOD PRESSURE SUPPORT)
PREFILLED_SYRINGE | INTRAVENOUS | Status: DC | PRN
  Administered 2016-09-29: 120 ug via INTRAVENOUS
  Administered 2016-09-29: 40 ug via INTRAVENOUS
  Administered 2016-09-29: 80 ug via INTRAVENOUS

## 2016-09-29 MED ORDER — LORATADINE 10 MG PO TABS
10.0000 mg | ORAL_TABLET | Freq: Every day | ORAL | Status: DC
  Administered 2016-09-29 – 2016-09-30 (×2): 10 mg via ORAL
  Filled 2016-09-29 (×2): qty 1

## 2016-09-29 MED ORDER — FENTANYL CITRATE (PF) 250 MCG/5ML IJ SOLN
INTRAMUSCULAR | Status: DC | PRN
  Administered 2016-09-29 (×2): 50 ug via INTRAVENOUS

## 2016-09-29 MED ORDER — SODIUM CHLORIDE 0.9% FLUSH: 3.0000 mL | INTRAVENOUS | Status: DC | PRN

## 2016-09-29 MED ORDER — DOBUTAMINE IN D5W 4-5 MG/ML-% IV SOLN
INTRAVENOUS | Status: DC | PRN
  Administered 2016-09-29: 20 ug/kg/min via INTRAVENOUS

## 2016-09-29 MED ORDER — SUGAMMADEX SODIUM 200 MG/2ML IV SOLN
INTRAVENOUS | Status: DC | PRN
  Administered 2016-09-29: 100 mg via INTRAVENOUS

## 2016-09-29 MED ORDER — HEPARIN (PORCINE) IN NACL 2-0.9 UNIT/ML-% IJ SOLN
INTRAMUSCULAR | Status: AC | PRN
  Administered 2016-09-29: 2000 mL

## 2016-09-29 MED ORDER — DOBUTAMINE IN D5W 4-5 MG/ML-% IV SOLN
INTRAVENOUS | Status: AC
  Filled 2016-09-29: qty 250

## 2016-09-29 SURGICAL SUPPLY — 19 items
BAG SNAP BAND KOVER 36X36 (MISCELLANEOUS) ×2 IMPLANT
BLANKET WARM UNDERBOD FULL ACC (MISCELLANEOUS) ×3 IMPLANT
CATH SMTCH THERMOCOOL SF DF (CATHETERS) ×2 IMPLANT
CATH SOUNDSTAR 3D IMAGING (CATHETERS) ×2 IMPLANT
CATH VARIABLE LASSO NAV 2515 (CATHETERS) ×2 IMPLANT
CATH WEBSTER BI DIR CS D-F CRV (CATHETERS) ×2 IMPLANT
COVER SWIFTLINK CONNECTOR (BAG) ×3 IMPLANT
NDL TRANSSEPTAL BRK 98CM (NEEDLE) IMPLANT
NEEDLE TRANSSEPTAL BRK 98CM (NEEDLE) ×3 IMPLANT
PACK EP LATEX FREE (CUSTOM PROCEDURE TRAY) ×3
PACK EP LF (CUSTOM PROCEDURE TRAY) ×1 IMPLANT
PAD DEFIB LIFELINK (PAD) ×3 IMPLANT
PATCH CARTO3 (PAD) ×2 IMPLANT
SHEATH AGILIS NXT 8.5F 71CM (SHEATH) ×4 IMPLANT
SHEATH AVANTI 11F 11CM (SHEATH) ×2 IMPLANT
SHEATH PINNACLE 7F 10CM (SHEATH) ×2 IMPLANT
SHEATH PINNACLE 8F 10CM (SHEATH) ×4 IMPLANT
SHEATH PINNACLE 9F 10CM (SHEATH) ×4 IMPLANT
TUBING SMART ABLATE COOLFLOW (TUBING) ×2 IMPLANT

## 2016-09-29 NOTE — Progress Notes (Signed)
Site area: 2 rt fv sheaths Site Prior to Removal:  Level 0 Pressure Applied For: 20 minutes Manual:   yes Patient Status During Pull:  stable Post Pull Site:  Level 0 Post Pull Instructions Given:  yes Post Pull Pulses Present: palpable Dressing Applied:  Gauze and tegaderm Bedrest begins @ 1630 Comments:

## 2016-09-29 NOTE — Anesthesia Postprocedure Evaluation (Signed)
Anesthesia Post Note  Patient: Fred Thompson  Procedure(s) Performed: Procedure(s) (LRB): Atrial Fibrillation Ablation (N/A)     Patient location during evaluation: PACU Anesthesia Type: General Level of consciousness: awake and alert Pain management: pain level controlled Vital Signs Assessment: post-procedure vital signs reviewed and stable Respiratory status: spontaneous breathing, nonlabored ventilation, respiratory function stable and patient connected to nasal cannula oxygen Cardiovascular status: blood pressure returned to baseline and stable Postop Assessment: no signs of nausea or vomiting Anesthetic complications: no    Last Vitals:  Vitals:   09/29/16 1518 09/29/16 1520  BP:  131/74  Pulse:  90  Resp:  13  Temp: (!) 36.4 C     Last Pain:  Vitals:   09/29/16 1518  TempSrc: Temporal                 Laini Urick

## 2016-09-29 NOTE — Discharge Instructions (Signed)
No driving for 4 days. No lifting over 5 lbs for 1 week. No vigorous or sexual activity for 1 week. You may return to work on 10/06/16. Keep procedure site clean & dry. If you notice increased pain, swelling, bleeding or pus, call/return!  You may shower, but no soaking baths/hot tubs/pools for 1 week.    You have an appointment set up with the Atrial Fibrillation Clinic.  Multiple studies have shown that being followed by a dedicated atrial fibrillation clinic in addition to the standard care you receive from your other physicians improves health. We believe that enrollment in the atrial fibrillation clinic will allow [place] to better care for you.   The phone number to the Atrial Fibrillation Clinic is [phone]. The clinic is staffed Monday through Friday from 8:30am to 5pm.  Parking Directions: The clinic is located in the Heart and Vascular Building connected to [facility]. 1)From [address] turn on to [address] and go to the 3rd entrance  (Heart and Vascular entrance) on the right. 2)Look to the right for Heart &Vascular Parking Garage. 3)The code for the entrance is 7000 for July.   4)Take the elevators to the 1st floor. Registration is in the room with the glass walls at the end of the hallway.  If you have any trouble parking or locating the clinic, please dont hesitate to call [phone].

## 2016-09-29 NOTE — Anesthesia Preprocedure Evaluation (Addendum)
Anesthesia Evaluation  Patient identified by MRN, date of birth, ID band Patient awake    Reviewed: Allergy & Precautions, NPO status , Patient's Chart, lab work & pertinent test results  Airway Mallampati: II  TM Distance: >3 FB Neck ROM: Full    Dental no notable dental hx. (+) Dental Advisory Given, Teeth Intact   Pulmonary neg pulmonary ROS,    Pulmonary exam normal breath sounds clear to auscultation       Cardiovascular negative cardio ROS Normal cardiovascular exam Rhythm:Regular Rate:Normal     Neuro/Psych negative neurological ROS  negative psych ROS   GI/Hepatic negative GI ROS, Neg liver ROS,   Endo/Other  negative endocrine ROS  Renal/GU negative Renal ROS  negative genitourinary   Musculoskeletal negative musculoskeletal ROS (+)   Abdominal   Peds negative pediatric ROS (+)  Hematology negative hematology ROS (+)   Anesthesia Other Findings   Reproductive/Obstetrics negative OB ROS                            Anesthesia Physical Anesthesia Plan  ASA: II  Anesthesia Plan: General   Post-op Pain Management:    Induction: Intravenous  PONV Risk Score and Plan: 2 and Ondansetron, Dexamethasone and Treatment may vary due to age or medical condition  Airway Management Planned: Oral ETT  Additional Equipment:   Intra-op Plan:   Post-operative Plan: Extubation in OR  Informed Consent: I have reviewed the patients History and Physical, chart, labs and discussed the procedure including the risks, benefits and alternatives for the proposed anesthesia with the patient or authorized representative who has indicated his/her understanding and acceptance.   Dental advisory given  Plan Discussed with:   Anesthesia Plan Comments: (  )        Anesthesia Quick Evaluation

## 2016-09-29 NOTE — Transfer of Care (Signed)
Immediate Anesthesia Transfer of Care Note  Patient: Fred Thompson  Procedure(s) Performed: Procedure(s): Atrial Fibrillation Ablation (N/A)  Patient Location: Cath Lab  Anesthesia Type:General  Level of Consciousness: drowsy and patient cooperative  Airway & Oxygen Therapy: Patient Spontanous Breathing and Patient connected to nasal cannula oxygen  Post-op Assessment: Report given to RN, Post -op Vital signs reviewed and stable and Patient moving all extremities X 4  Post vital signs: Reviewed and stable  Last Vitals:  Vitals:   09/29/16 0940  BP: (!) 145/90  Pulse: 84  Temp: 36.6 C    Last Pain:  Vitals:   09/29/16 0940  TempSrc: Oral         Complications: No apparent anesthesia complications

## 2016-09-29 NOTE — Anesthesia Procedure Notes (Signed)
Procedure Name: Intubation Date/Time: 09/29/2016 11:41 AM Performed by: Fred Thompson Pre-anesthesia Checklist: Patient identified, Emergency Drugs available, Suction available and Patient being monitored Patient Re-evaluated:Patient Re-evaluated prior to induction Oxygen Delivery Method: Circle system utilized Preoxygenation: Pre-oxygenation with 100% oxygen Induction Type: IV induction Ventilation: Mask ventilation without difficulty and Two handed mask ventilation required Laryngoscope Size: Mac and 4 Grade View: Grade II Tube type: Oral Tube size: 7.5 mm Number of attempts: 1 Airway Equipment and Method: Stylet Placement Confirmation: ETT inserted through vocal cords under direct vision,  positive ETCO2 and breath sounds checked- equal and bilateral Secured at: 24 cm Tube secured with: Tape Dental Injury: Teeth and Oropharynx as per pre-operative assessment

## 2016-09-29 NOTE — Progress Notes (Signed)
ACT 197. OK w/Dr. Elberta Fortis to pull

## 2016-09-29 NOTE — Plan of Care (Signed)
Problem: Education: Goal: Knowledge of  General Education information/materials will improve Outcome: Progressing Oriented to room and unit.    

## 2016-09-29 NOTE — H&P (Signed)
History of Present Illness: Fred Thompson is a 56 y.o. male who is being seen today for the evaluation of atrial fibrillation/flutter at the request of [name], [name], MD. Presenting today for electrophysiology evaluation.  He was initially diagnosed with atrial fibrillation in October 2017. Has also had atrial flutter. Plan for ablation today.    Today, he denies symptoms of palpitations, chest pain, shortness of breath, orthopnea, PND, lower extremity edema, claudication, dizziness, presyncope, syncope, bleeding, or neurologic sequela. The patient is tolerating medications without difficulties. His only complaint today is of fatigue.       Past Medical History:  Diagnosis Date  . Allergic rhinitis 01/20/2016  . Bell's palsy   . BPH (benign prostatic hyperplasia) 01/01/2016  . Dyslipidemia (high LDL; low HDL) 01/01/2016  . 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 Hemorrhoids 01/20/2016  . Irregular heartbeat   . Near syncope 01/01/2016  . Paroxysmal atrial flutter (HCC) 01/02/2016  . Pure hypercholesterolemia 01/20/2016        Past Surgical History:  Procedure Laterality Date  . APPENDECTOMY  1973  . KNEE SURGERY Right 1999           Current Outpatient Prescriptions  Medication Sig Dispense Refill  . Cholecalciferol (D3 SUPER STRENGTH) 2000 units CAPS Take 2,000 Units by mouth daily.    Marland Kitchen diltiazem (DILACOR XR) 180 MG 24 hr capsule Take 180 mg by mouth daily.    . finasteride (PROSCAR) 5 MG tablet Take 5 mg by mouth daily.    . hydrocortisone-pramoxine (PROCTOFOAM HC) rectal foam Place 1 application rectally as needed.    . MULTAQ 400 MG tablet Take 400 mg by mouth 2 (two) times daily.  2  . vitamin E 400 UNIT capsule Take 400 Units by mouth daily.    Carlena Hurl 20 MG TABS tablet Take 20 mg by mouth daily.  3   No current facility-administered medications for this visit.     Allergies:   Atorvastatin   Social History:  The patient  reports that he has never  smoked. He has never used smokeless tobacco. He reports that he drinks alcohol.   Family History:  The patient's family history includes CAD in his father; Healthy in his sister.    ROS:  Please see the history of present illness.   Otherwise, review of systems is positive for none.   All other systems are reviewed and negative.     PHYSICAL EXAM: VS:  BP (!) 145/90 (BP Location: Right Arm)   Pulse 84   Temp 97.9 F (36.6 C) (Oral)   Ht 6\' 5"  (1.956 m)   Wt 221 lb (100.2 kg)   SpO2 97%   BMI 26.21 kg/m  , BMI Body mass index is 26.21 kg/m. GEN: Well nourished, well developed, in no acute distress  HEENT: normal  Neck: no JVD, carotid bruits, or masses Cardiac: RRR; no murmurs, rubs, or gallops,no edema  Respiratory:  clear to auscultation bilaterally, normal work of breathing GI: soft, nontender, nondistended, + BS MS: no deformity or atrophy  Skin: warm and dry Neuro:  Strength and sensation are intact Psych: euthymic mood, full affect  EKG:  EKG is ordered today. Personal review of the ekg ordered shows sinus rhythm   Lipid Panel  Labs(Brief)  No results found for: CHOL, TRIG, HDL, CHOLHDL, VLDL, LDLCALC, LDLDIRECT        Wt Readings from Last 3 Encounters:  08/28/16 237 lb 12.8 oz (107.9 kg)  08/09/16 239 lb (108.4 kg)  04/04/16 236 lb (107 kg)      Other studies Reviewed: Additional studies/ records that were reviewed today include: ETT 05/15/16  Review of the above records today demonstrates:    Blood pressure demonstrated a hypertensive response to exercise. 1 mm upsloping ST depression in inferior leads with exercise Non diagnostic due to achieving onlyh 82% of PMHR PVC;s and bigemminy with stress HTN response to exercise   TTE 01/17/17 Mild global hypokinesis, EF 45-50%, mildly dilated left atrium 4.1 cm  ASSESSMENT AND PLAN:  1.  Atrial fibrillation/flutter: Plan for ablation of atrial fibrilation and atrial flutter. Risks and  benefits discussed. Risks include but not limited to bleeding, tamponade, heart block, stroke, and damage to surrounding organs. The patient understands the risks and has agreed to the procedure. Of note, the patient last took his Xarelto on Wednesday. He is in sinus rhythm today, and had a CT scan without thrombus. Will check for thrombus on ICE.  This patients CHA2DS2-VASc Score and unadjusted Ischemic Stroke Rate (% per year) is equal to 0.6 % stroke rate/year from a score of 1  Above score calculated as 1 point each if present [CHF, HTN, DM, Vascular=MI/PAD/Aortic Plaque, Age if 65-74, or Male] Above score calculated as 2 points each if present [Age > 75, or Stroke/TIA/TE]  2. PVCs: No PVCs on his EKG today. Continue current management.  3. Mild LV dysfunction: We'll defer adding ACE inhibitor/ARB to Dr. Jacinto Halim. Appears euvolemic on exam.  Signed, Will Jorja Loa, MD  08/28/2016 9:10 AM

## 2016-09-29 NOTE — Progress Notes (Signed)
Site area: 2 lt fv sheaths pulled and pressure held by [employer] Prior to Removal:  Level 0 Pressure Applied For: 20 minutes Manual:   yes Patient Status During Pull:  stable Post Pull Site:  Level 0 Post Pull Instructions Given:  yes Post Pull Pulses Present: yes Dressing Applied:  Gauze and tegaderm Bedrest begins @ 1630 Comments: IV saline locked-

## 2016-09-30 ENCOUNTER — Encounter (HOSPITAL_COMMUNITY): Payer: Self-pay | Admitting: Physician Assistant

## 2016-09-30 DIAGNOSIS — D72819 Decreased white blood cell count, unspecified: Secondary | ICD-10-CM

## 2016-09-30 DIAGNOSIS — D649 Anemia, unspecified: Secondary | ICD-10-CM

## 2016-09-30 DIAGNOSIS — I480 Paroxysmal atrial fibrillation: Secondary | ICD-10-CM | POA: Diagnosis not present

## 2016-09-30 LAB — CBC
HCT: 34.7 % — ABNORMAL LOW (ref 39.0–52.0)
Hemoglobin: 11.2 g/dL — ABNORMAL LOW (ref 13.0–17.0)
MCH: 27.8 pg (ref 26.0–34.0)
MCHC: 32.3 g/dL (ref 30.0–36.0)
MCV: 86.1 fL (ref 78.0–100.0)
PLATELETS: 181 10*3/uL (ref 150–400)
RBC: 4.03 MIL/uL — AB (ref 4.22–5.81)
RDW: 13.8 % (ref 11.5–15.5)
WBC: 5.6 10*3/uL (ref 4.0–10.5)

## 2016-09-30 NOTE — Care Management Note (Signed)
Case Management Note  Patient Details  Name: Fred Thompson MRN: [id number] Date of Birth: 05-Jun-1960  Subjective/Objective:                 Patient with order to DC to home today. Chart reviewed. No Home Health or Equipment needs, no unacknowledged Case Management consults or medication needs identified at the time of this note. Plan for DC to home. If needs arise today prior to discharge, please call Lawerance Sabal RN CM at [phone].    Action/Plan:   Expected Discharge Date:  09/30/16               Expected Discharge Plan:  Home/Self Care  In-House Referral:     Discharge planning Services  CM Consult  Post Acute Care Choice:    Choice offered to:     DME Arranged:    DME Agency:     HH Arranged:    HH Agency:     Status of Service:  Completed, signed off  If discussed at [employer] of Stay Meetings, dates discussed:    Additional Comments:  Lawerance Sabal, RN 09/30/2016, 9:30 AM

## 2016-09-30 NOTE — Discharge Summary (Signed)
Discharge Summary    Patient ID: Fred Thompson  MRN: [id number], DOB/AGE: [birth year] 56 y.o.  Admit Date: 09/29/2016 Discharge Date: 09/30/2016  Primary Care Provider: [name], MD Primary Cardiologist: Drs. Katheren Puller, MD Primary Electrophysiologist: Dr. Elberta Fortis, MD   Discharge Diagnoses    Principal Problem:   PAF (paroxysmal atrial fibrillation) [facility]) Active Problems:   Paroxysmal atrial flutter (HCC)   Dyslipidemia (high LDL; low HDL)   Frequent PVCs   Leukopenia   Anemia   Allergies Allergies  Allergen Reactions  . Atorvastatin     weakness     History of Present Illness     56 year old male with PAF/flutter on Xarelto, mild LV dysfunction, frequent PVCs, near syncope, HLD, BPH, and Bell's palsy who presented to [facility] for planned Afib/flutter ablation.   Patient was seen by Dr, Elberta Fortis on 08/28/2016 for evaluation of Afib/flutter. She was initially diagnosed with Afib in 12/2015 with presenting symptoms of dizziness, diaphoresis, and syncope. He had been placed on flecainide, though did not tolerate that medication due to fatigue. He was changed to [facility] in June 2018. Prior normal stress test in 04/2016. Echo in 12/2015 that showed EF 45-50% with global hypokinesis. He continued to note significant fatigue on Multaq. He ultimately decided to undergo Afib ablation.   Hospital Course     Consultants: None.  He presented to [facility] on 09/29/2016 for planned Afib ablation. He underwent successful Afib ablation with comprehensive electrophysiologic study, coronary sinus pacing and recording, three-dimensional mapping of atrial fibrillation with additional mapping and ablation of a second discrete focus (atrial flutter), ablation of atrial fibrillation with additional mapping and ablation of a second discrete focus (atrial flutter), intracardiac echocardiography, transseptal puncture of an intact septum, arrhythmia induction with pacing  with isuprel infusion. Post-procedure there was restoration of sinus rhythm. No inducible arrhythmias were noted following ablation both on and off of dobutamine. There were not post-procedure complications. He will continue Xarelto until 3 months post procedure per EP note from 08/28/16. He did continue to note some fatigue that was not felt to be arrhythmia related, as well as a mild sore throat. Post-procedure CBC stable. He was advised to follow up with PCP and consider outpatient sleep study.   The patient's right femoral cath site has been examined is healing well without issues at this time. The patient has been seen by Dr. Krista Blue and felt to be stable for discharge today. All follow up appointments have been made. Discharge medications are listed below. Prescriptions have been reviewed with the patient and sent in to their pharmacy.  _____________  Discharge Vitals Blood pressure 115/75, pulse 87, temperature 98.6 F (37 C), temperature source Oral, resp. rate 15, height 6\' 5"  (1.956 m), weight 227 lb 1.6 oz (103 kg), SpO2 98 %.  Filed Weights   09/29/16 0940 09/30/16 0458  Weight: 221 lb (100.2 kg) 227 lb 1.6 oz (103 kg)    Labs & Radiologic Studies    CBC  Recent Labs  09/29/16 1028  WBC 3.4*  HGB 12.2*  HCT 37.2*  MCV 84.5  PLT 187   Basic Metabolic Panel  Recent Labs  09/29/16 1028  NA 137  K 3.8  CL 107  CO2 24  GLUCOSE 98  BUN 10  CREATININE 1.00  CALCIUM 8.7*   Liver Function Tests No results for input(s): AST, ALT, ALKPHOS, BILITOT, PROT, ALBUMIN in the last 72 hours. No results for input(s): LIPASE, AMYLASE in  the last 72 hours. Cardiac Enzymes No results for input(s): CKTOTAL, CKMB, CKMBINDEX, TROPONINI in the last 72 hours. BNP Invalid input(s): POCBNP D-Dimer No results for input(s): DDIMER in the last 72 hours. Hemoglobin A1C No results for input(s): HGBA1C in the last 72 hours. Fasting Lipid Panel No results for input(s): CHOL, HDL, LDLCALC,  TRIG, CHOLHDL, LDLDIRECT in the last 72 hours. Thyroid Function Tests No results for input(s): TSH, T4TOTAL, T3FREE, THYROIDAB in the last 72 hours.  Invalid input(s): FREET3 _____________  Ct Cardiac Morph/pulm Vein W/cm&w/o Ca Score  Addendum Date: 09/23/2016   IMPRESSION: 1.  Nonobstructive coronary disease as described above. 2.  No LA appendage thrombus noted. 3.  Normal pulmonary vein pattern. 4. Coronary artery calcium score 327 Agatston units, this places the patient in the 89th percentile for his age and gender. This suggests high risk for future cardiac events. Dalton Mclean Electronically Signed   By: Marca Ancona M.D.   On: 09/23/2016 10:32   Result Date: 09/23/2016 IMPRESSION: 1. Aortic atherosclerosis. 2. Small hiatal hernia. Aortic Atherosclerosis (ICD10-I70.0). Electronically Signed: By: Trudie Reed M.D. On: 09/22/2016 15:49   Ct Coronary Fractional Flow Reserve Fluid Analysis  Addendum Date: 09/23/2016   IMPRESSION: 1.  Nonobstructive coronary disease as described above. 2.  No LA appendage thrombus noted. 3.  Normal pulmonary vein pattern. 4. Coronary artery calcium score 327 Agatston units, this places the patient in the 89th percentile for his age and gender. This suggests high risk for future cardiac events. Dalton Mclean Electronically Signed   By: Marca Ancona M.D.   On: 09/23/2016 10:32   Result Date: 09/23/2016 EXAM: OVER-READ INTERPRETATION  CT CHEST  IMPRESSION: 1. Aortic atherosclerosis. 2. Small hiatal hernia. Aortic Atherosclerosis (ICD10-I70.0). Electronically Signed: By: Trudie Reed M.D. On: 09/22/2016 15:49    Diagnostic Studies/Procedures   Afib ablation 09/29/2016: PREPROCEDURE DIAGNOSES: 1. Paroxysmal atrial fibrillation. 2. Typical appearing atrial flutter  POSTPROCEDURE DIAGNOSES: 1. Paroxysmal  atrial fibrillation. 2. Typical appearing atrial flutter  PROCEDURES: 1. Comprehensive electrophysiologic study. 2. Coronary sinus pacing and  recording. 3. Three-dimensional mapping of atrial fibrillation with additional mapping and ablation of a second discrete focus (atrial flutter) 4. Ablation of atrial fibrillation with additional mapping and ablation of a second discrete focus (atrial flutter) 5. Intracardiac echocardiography. 6. Transseptal puncture of an intact septum. 7. Arrhythmia induction with pacing with isuprel infusion  INTRODUCTION:  HYUN MARSALIS is a 56 y.o. male with a history of paroxysmal atrial fibrillation and typical appearing atrial flutter who now presents for EP study and radiofrequency ablation.  The patient reports initially being diagnosed with atrial fibrillation after presenting with symptomatic palpitations and fatgiue. The patient reports increasing frequency and duration of atrial arrhythmias since that time.  The patient has failed medical therapy with flecainide, Multaq.  The patient therefore presents today for catheter ablation of atrial fibrillation and atrial flutter.  DESCRIPTION OF PROCEDURE:  Informed written consent was obtained, and the patient was brought to the electrophysiology lab in a fasting state.  The patient was adequately sedated with intravenous medications as outlined in the anesthesia report.  The patient's left and right groins were prepped and draped in the usual sterile fashion by the EP lab staff.  Using a percutaneous Seldinger technique, two 8-French hemostasis sheaths were placed in the right common femoral vein; one 7-French and one 11-French hemostasis sheaths were placed into the left common femoral vein.    Catheter Placement:  A 7-French Biosense Webster Decapolar coronary sinus  catheter was introduced through the right common femoral vein and advanced into the coronary sinus for recording and pacing from this location.    Initial Measurements: The patient presented to the electrophysiology lab in sinus rhythm.  The patients PR interval measured 135 msec with a QRS  duration of 115 msec and a QT interval of 409 msec.    Intracardiac Echocardiography: A 10-French Biosense Webster AcuNav intracardiac echocardiography catheter was introduced through the right common femoral vein and advanced into the right atrium. Intracardiac echocardiography was performed of the left atrium, and a three-dimensional anatomical rendering of the left atrium was performed using CARTO sound technology.  The patient was noted to have a moderate sized left atrium.  The interatrial septum was prominent but not aneurysmal. All 4 pulmonary veins were visualized and noted to have separate ostia.  The pulmonary veins were moderate in size.  The left atrial appendage was visualized and did not reveal thrombus.   There was no evidence of pulmonary vein stenosis.   Transseptal Puncture: The right common femoral vein sheaths were was exchanged for an 8.5 [place] Agillis transseptal sheath and transseptal access was achieved in a standard fashion using a Brockenbrough needle under biplane fluoroscopy with intracardiac echocardiography confirmation of the transseptal puncture.  Once transseptal access had been achieved, heparin was administered intravenously and intra- arterially in order to maintain an ACT of greater than 350 seconds throughout the procedure.   3D Mapping and Ablation: A 3.5 mm Biosense Webster SmartTouch Thermocool ablation catheter was advanced into the right atrium.  The transseptal sheath was pulled back into the IVC over a guidewire.  The ablation catheter was advanced across the transseptal hole using the wire as a guide.  The transseptal sheath was then re-advanced over the guidewire into the left atrium.  A duodecapolar Biosense Webster circular mapping catheter was introduced through the transseptal sheath and positioned over the mouth of all 4 pulmonary veins.  Three-dimensional electroanatomical mapping was performed using CARTO technology.  This demonstrated electrical  activity within all four pulmonary veins at baseline. The patient underwent successful sequential electrical isolation and anatomical encircling of all four pulmonary veins using radiofrequency current with a circular mapping catheter as a guide.  A WACA approach was used.  Entrance and exit block were achieved.  The ablation catheter was then pulled back into the right atrial and positioned along the cavo-tricuspid isthmus.  Mapping along the atrial side of the isthmus was performed.  This demonstrated a standard isthmus.  A series of radiofrequency applications were then delivered along the isthmus.  Complete bidirectional cavotricuspid isthmus block was achieved as confirmed by differential atrial pacing from the low lateral right atrium.  A stimulus to earliest atrial activation across the isthmus measured 130 msec bi-directionally.  The patient was observe without return of conduction through the isthmus.  Measurements Following Ablation: Following ablation, Isuprel was infused up to 20 mcg/min with no inducible atrial fibrillation, atrial tachycardia, atrial flutter, or sustained PACs. In sinus rhythm with RR interval was 679 msec, with PR 135 msec, QRS 115 msec, and Qt 398 msec.  Following ablation the AH interval measured 68 msec with an HV interval of 54 msec. Ventricular pacing was performed, which revealed midline concentric decremental VA conduction with a VA WCL of 420 msec.  Rapid atrial pacing was performed, which revealed an AV Wenckebach cycle length of 330 msec.  Electroisolation was then again confirmed in all four pulmonary veins. AVNERP 600/210 msec.  Intracardiac echocardiography was  again performed, which revealed no pericardial effusion.  The procedure was therefore considered completed.  All catheters were removed, and the sheaths were aspirated and flushed.  The patient was transferred to the recovery area for sheath removal per protocol.  A limited bedside transthoracic  echocardiogram revealed no pericardial effusion. EBL<68ml.  There were no early apparent complications.  CONCLUSIONS: 1. Sinus rhythm upon presentation.   2. Successful electrical isolation and anatomical encircling of all four pulmonary veins with radiofrequency current.    3. Cavo-tricuspid isthmus ablation was performed with complete bidirectional isthmus block achieved.  4. No inducible arrhythmias following ablation both on and off of dobutamine 5. No early apparent complications. _____________  Disposition   Pt is being discharged home today in good condition.  Follow-up Plans & Appointments    Follow-up Information    [facility] Follow up on 10/30/2016.   Specialty:  Cardiology Why:  9:30AM Contact information: [address] [id number] mc [address] [place] [address] [phone]       Regan Lemming, MD Follow up on 01/08/2017.   Specialty:  Cardiology Why:  11:15AM Contact information: [address] [place] [address] [phone]        [facility] Follow up.   Why:  Message has been sent to our office for follow up. If patient has not heard from our office by 7/19, he is advised to contact our office for appointment.  Contact information: [address] [place] [address] [phone]         Discharge Instructions    Call MD for:  difficulty breathing, headache or visual disturbances    Complete by:  As directed    Call MD for:  extreme fatigue    Complete by:  As directed    Call MD for:  hives    Complete by:  As directed    Call MD for:  persistant dizziness or light-headedness    Complete by:  As directed    Call MD for:  persistant nausea and vomiting    Complete by:  As directed    Call MD for:  redness, tenderness, or signs of infection (pain, swelling, redness, odor or green/yellow discharge around  incision site)    Complete by:  As directed    Call MD for:  severe uncontrolled pain    Complete by:  As directed    Call MD for:  temperature >100.4    Complete by:  As directed    Diet - low sodium heart healthy    Complete by:  As directed    Increase activity slowly    Complete by:  As directed       Discharge Medications      Medication List    TAKE these medications   cetirizine 10 MG tablet Commonly known as:  ZYRTEC Take 10 mg by mouth daily as needed for allergies.   D3 SUPER STRENGTH 2000 units Caps Generic drug:  Cholecalciferol Take 2,000 Units by mouth daily.   diltiazem 180 MG 24 hr capsule Commonly known as:  DILACOR XR Take 180 mg by mouth daily.   finasteride 5 MG tablet Commonly known as:  PROSCAR Take 5 mg by mouth daily.   vitamin E 400 UNIT capsule Take 400 Units by mouth daily.   XARELTO 20 MG Tabs tablet Generic drug:  rivaroxaban Take 20 mg by mouth daily with supper.  Aspirin prescribed at discharge?  No: Xarelto High Intensity Statin Prescribed? (Lipitor 40-80mg  or Crestor 20-40mg ): No: Intolerant  Beta Blocker Prescribed? No: on CCB For EF <40%, was ACEI/ARB Prescribed? No: Deferred to primary cardiologist ADP Receptor Inhibitor Prescribed? (i.e. Plavix etc.-Includes Medically Managed Patients): No: Not indicated For EF <40%, Aldosterone Inhibitor Prescribed? No: EF > 40% Was EF assessed during THIS hospitalization? No: Recent echo as above Was Cardiac Rehab II ordered? (Included Medically managed Patients): No   Outstanding Labs/Studies   None.   Duration of Discharge Encounter   Greater than 30 minutes including physician time.  Signed, Sondra Barges, PA-C [facility] Pager: [phone] 09/30/2016, 9:01 AM

## 2016-09-30 NOTE — Progress Notes (Signed)
   Progress Note  Patient Name: Fred Thompson Date of Encounter: 09/30/2016  Primary Cardiologist: Ottis Stain  Primary Electrophysiologist: [facility]   Patient Profile     56 y.o. male admitted for PVI   EF 45-50%  Failed flecainide   Subjective   With sore throat and some chest pain  Inpatient Medications    Scheduled Meds: . cholecalciferol  2,000 Units Oral Daily  . diltiazem  180 mg Oral Daily  . finasteride  5 mg Oral Daily  . loratadine  10 mg Oral Daily  . rivaroxaban  20 mg Oral Q supper  . sodium chloride flush  3 mL Intravenous Q12H  . vitamin E  400 Units Oral Daily   Continuous Infusions: . sodium chloride     PRN Meds: sodium chloride, acetaminophen, ondansetron (ZOFRAN) IV, sodium chloride flush   Vital Signs    Vitals:   09/30/16 0036 09/30/16 0245 09/30/16 0458 09/30/16 0738  BP: 124/83 111/78 101/62 115/75  Pulse: 87 82 83 87  Resp: 12 16 16 15   Temp: 98.3 F (36.8 C)  98.6 F (37 C) 98.6 F (37 C)  TempSrc: Oral  Oral Oral  SpO2: 98% 99% 100% 98%  Weight:   227 lb 1.6 oz (103 kg)   Height:        Intake/Output Summary (Last 24 hours) at 09/30/16 0748 Last data filed at 09/30/16 0316  Gross per 24 hour  Intake             1240 ml  Output             1030 ml  Net              210 ml   Filed Weights   09/29/16 0940 09/30/16 0458  Weight: 221 lb (100.2 kg) 227 lb 1.6 oz (103 kg)    Telemetry    Few PACs- Personally Reviewed  ECG    Sinus at 83   - Personally Reviewed  Physical Exam    GEN: No acute distress.   Neck: JVD flat Cardiac: RRR, no murmurs, rubs, or gallops.  Respiratory: Clear to auscultation bilaterally. GI: Soft, nontender, non-distended  MS:  edema; No deformity. Groins ok  Neuro:  Nonfocal  Psych: Normal affect  Skin Warm and dry   Labs    Chemistry Recent Labs Lab 09/29/16 1028  NA 137  K 3.8  CL 107  CO2 24  GLUCOSE 98  BUN 10  CREATININE 1.00  CALCIUM 8.7*  GFRNONAA >60  GFRAA >60  ANIONGAP 6      Hematology Recent Labs Lab 09/29/16 1028  WBC 3.4*  RBC 4.40  HGB 12.2*  HCT 37.2*  MCV 84.5  MCH 27.7  MCHC 32.8  RDW 13.2  PLT 187    Cardiac EnzymesNo results for input(s): TROPONINI in the last 168 hours. No results for input(s): TROPIPOC in the last 168 hours.   BNPNo results for input(s): BNP, PROBNP in the last 168 hours.   DDimer No results for input(s): DDIMER in the last 168 hours.   Radiology    No results found.       Assessment & Plan    Atrial fib  Paroxysmal Fatigue  Pt with afib s/p PVI Fatigue is generalized and not clearly afib related although aggravated by this.  suspeect drug or OSA   Ok for discharge a  Signed, Sherryl Manges, MD  09/30/2016, 7:48 AM

## 2016-10-02 ENCOUNTER — Encounter (HOSPITAL_COMMUNITY): Payer: Self-pay | Admitting: Cardiology

## 2016-10-04 ENCOUNTER — Telehealth: Payer: Self-pay | Admitting: Neurology

## 2016-10-04 NOTE — Telephone Encounter (Signed)
Called pt to discuss sleep study results. I explained to the pt that he had a mild degree of sleep apnea and but that it was considered central apnea. Per dr Dohmeier recomendations I expressed the need for a cpap titration study to get him set up and make sure that cpap wouldn't be the cause of the triggering the cental apnea. Pt verbalized understanding and agreeable to coming into the titration study. I explained that the sleep lab will be contacting him to set that up.

## 2016-10-25 ENCOUNTER — Ambulatory Visit (INDEPENDENT_AMBULATORY_CARE_PROVIDER_SITE_OTHER): Payer: Managed Care, Other (non HMO) | Admitting: Neurology

## 2016-10-25 DIAGNOSIS — G4737 Central sleep apnea in conditions classified elsewhere: Secondary | ICD-10-CM

## 2016-10-25 DIAGNOSIS — R0683 Snoring: Secondary | ICD-10-CM

## 2016-10-25 DIAGNOSIS — I4892 Unspecified atrial flutter: Secondary | ICD-10-CM

## 2016-10-30 ENCOUNTER — Ambulatory Visit (HOSPITAL_COMMUNITY)
Admission: RE | Admit: 2016-10-30 | Discharge: 2016-10-30 | Disposition: A | Discharge status: Home / Self Care | Payer: Managed Care, Other (non HMO) | Source: Ambulatory Visit | Attending: Nurse Practitioner | Admitting: Nurse Practitioner

## 2016-10-30 ENCOUNTER — Encounter (HOSPITAL_COMMUNITY): Payer: Self-pay | Admitting: Nurse Practitioner

## 2016-10-30 VITALS — BP 118/66 | HR 95 | Ht 77.0 in | Wt 226.2 lb

## 2016-10-30 DIAGNOSIS — Z8249 Family history of ischemic heart disease and other diseases of the circulatory system: Secondary | ICD-10-CM | POA: Insufficient documentation

## 2016-10-30 DIAGNOSIS — I480 Paroxysmal atrial fibrillation: Secondary | ICD-10-CM | POA: Diagnosis not present

## 2016-10-30 DIAGNOSIS — Z7901 Long term (current) use of anticoagulants: Secondary | ICD-10-CM | POA: Diagnosis not present

## 2016-10-30 DIAGNOSIS — Z79899 Other long term (current) drug therapy: Secondary | ICD-10-CM | POA: Insufficient documentation

## 2016-10-30 DIAGNOSIS — N400 Enlarged prostate without lower urinary tract symptoms: Secondary | ICD-10-CM | POA: Insufficient documentation

## 2016-10-30 DIAGNOSIS — E785 Hyperlipidemia, unspecified: Secondary | ICD-10-CM | POA: Diagnosis not present

## 2016-10-30 NOTE — Progress Notes (Signed)
Primary Care Physician: [name], MD Referring Physician: Dr. Randal Buba Fred Thompson is a 56 y.o. male with a h/o afib ablation one month ago in the afib clinic for further evaluation. He reports that he is doing well. Has not noted any afib. No groin or swallowing issues.  Today, he denies symptoms of palpitations, chest pain, shortness of breath, orthopnea, PND, lower extremity edema, dizziness, presyncope, syncope, or neurologic sequela. The patient is tolerating medications without difficulties and is otherwise without complaint today.   Past Medical History:  Diagnosis Date  . Allergic rhinitis 01/20/2016  . Bell's palsy   . BPH (benign prostatic hyperplasia) 01/01/2016  . Dyslipidemia (high LDL; low HDL) 01/01/2016  . 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 on diltiazem  . Hemorrhoids 01/20/2016  . LV dysfunction    a. echo 12/2015: EF 45-50%, mild global HK, normal diastolic function, mildly dilated LA, trace MR, trace TR, unable to estimate PASP  . Near syncope 01/01/2016  . PAF (paroxysmal atrial fibrillation) (HCC)    a. diagnosed in 12/2015; b. failed flecainide and multaq; c. CHADS2VASc => 1 (CHF); d. on xarelto; e. s/p successful Afib ablation 09/29/2016  . Paroxysmal atrial flutter (HCC) 01/02/2016   Past Surgical History:  Procedure Laterality Date  . APPENDECTOMY  1973  . ATRIAL FIBRILLATION ABLATION N/A 09/29/2016   Procedure: Atrial Fibrillation Ablation;  Surgeon: Regan Lemming, MD;  Location: [facility] INVASIVE CV LAB;  Service: Cardiovascular;  Laterality: N/A;  . KNEE SURGERY Right 1999    Current Outpatient Prescriptions  Medication Sig Dispense Refill  . cetirizine (ZYRTEC) 10 MG tablet Take 10 mg by mouth daily as needed for allergies.    . Cholecalciferol (D3 SUPER STRENGTH) 2000 units CAPS Take 2,000 Units by mouth daily.    Marland Kitchen diltiazem (DILACOR XR) 180 MG 24 hr capsule Take 180 mg by mouth daily.    . finasteride (PROSCAR) 5 MG tablet Take 5 mg by mouth  daily.    . vitamin E 400 UNIT capsule Take 400 Units by mouth daily.    Carlena Hurl 20 MG TABS tablet Take 20 mg by mouth daily with supper.   3   No current facility-administered medications for this encounter.     Allergies  Allergen Reactions  . Atorvastatin     weakness    Social History   Social History  . Marital status: Married    Spouse name: N/A  . Number of children: 1  . Years of education: N/A   Occupational History  . Not on file.   Social History Main Topics  . Smoking status: Never Smoker  . Smokeless tobacco: Never Used  . Alcohol use Yes     Comment: occasional  . Drug use: Unknown  . Sexual activity: Not on file   Other Topics Concern  . Not on file   Social History Narrative  . No narrative on file    Family History  Problem Relation Age of Onset  . CAD Father        quad bypass @ 69  . Healthy Sister     ROS- All systems are reviewed and negative except as per the HPI above  Physical Exam: Vitals:   10/30/16 0940  BP: 118/66  Pulse: 95  Weight: 226 lb 3.2 oz (102.6 kg)  Height: 6\' 5"  (1.956 m)   Wt Readings from Last 3 Encounters:  10/30/16 226 lb 3.2 oz (102.6 kg)  09/30/16 227 lb 1.6 oz (  103 kg)  08/28/16 237 lb 12.8 oz (107.9 kg)    Labs: Lab Results  Component Value Date   NA 137 09/29/2016   K 3.8 09/29/2016   CL 107 09/29/2016   CO2 24 09/29/2016   GLUCOSE 98 09/29/2016   BUN 10 09/29/2016   CREATININE 1.00 09/29/2016   CALCIUM 8.7 (L) 09/29/2016   No results found for: INR No results found for: CHOL, HDL, LDLCALC, TRIG   GEN- The patient is well appearing, alert and oriented x 3 today.   Head- normocephalic, atraumatic Eyes-  Sclera clear, conjunctiva pink Ears- hearing intact Oropharynx- clear Neck- supple, no JVP Lymph- no cervical lymphadenopathy Lungs- Clear to ausculation bilaterally, normal work of breathing Heart- Regular rate and rhythm, no murmurs, rubs or gallops, PMI not laterally displaced GI-  soft, NT, ND, + BS Extremities- no clubbing, cyanosis, or edema MS- no significant deformity or atrophy Skin- no rash or lesion Psych- euthymic mood, full affect Neuro- strength and sensation are intact  EKG- NSR at 95 bpm, PR int 126 ms, qrs int 106 ms, qtc 454 ms Epic records reviewed    Assessment and Plan: 1. 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 Elberta Fortis as scheduled 10/22 Dr. Ladona Ridgel 10/9   Elvina Sidle. [facility] [address] [place], [place] [address] [phone]

## 2016-11-03 ENCOUNTER — Other Ambulatory Visit: Payer: Self-pay | Admitting: *Deleted

## 2016-11-03 MED ORDER — ASPIRIN EC 81 MG PO TBEC: 81.0000 mg | DELAYED_RELEASE_TABLET | Freq: Every day | ORAL | 1 refills | Status: DC

## 2016-11-06 NOTE — Procedures (Signed)
PATIENT'S NAME:  Fred Thompson, Fred Thompson DOB:      Oct 23, 1960      MR#:    [id number]     DATE OF RECORDING: 10/25/2016 REFERRING M.D.:  Yates Decamp, MD Study Performed:   CPAP  Titration HISTORY:  Central sleep apnea due to medical - cardiac condition, Atrial flutter, Snoring, Myalgia due to statin, and EDS/ Fatigue/Tiredness. The patient endorsed the Epworth Sleepiness Scale at 15/24 points.   The patient's weight 239 pounds with a height of 77 (inches), resulting in a BMI of 28.1 kg/m2. The patient's neck circumference measured 17.5 inches.  CURRENT MEDICATIONS: Aspirin, Cholecalciferol, Dilacor, Proscar, Tambocor, Proctofoam, Vitamin E.    PROCEDURE:  This is a multichannel digital polysomnogram utilizing the SomnoStar 11.2 system.  Electrodes and sensors were applied and monitored per AASM Specifications.   EEG, EOG, Chin and Limb EMG, were sampled at 200 Hz.  ECG, Snore and Nasal Pressure, Thermal Airflow, Respiratory Effort, CPAP Flow and Pressure, Oximetry was sampled at 50 Hz. Digital video and audio were recorded.      CPAP was initiated at 5 cmH20 with heated humidity per AASM split night standards and pressure was advanced to 12 cmH20 because of hypopneas, apneas and desaturations.  At a PAP pressure of 12 cmH20, there was a reduction of the AHI to 0.0 with improvement of the above symptoms of obstructive sleep apnea.   Lights Out was at 20:58 and Lights On at 04:39. Total recording time (TRT) was 461.5 minutes, with a total sleep time (TST) of 406 minutes. The patient's sleep latency was 41.5 minutes with 2 minutes of wake time after sleep onset. REM latency was 88 minutes.  The sleep efficiency was 88.1 %.    SLEEP ARCHITECTURE: WASO (Wake after sleep onset) was 46.5 minutes.  There were 16.5 minutes in Stage N1, 129.5 minutes Stage N2, 179 minutes Stage N3 and 81 minutes in Stage REM.  The percentage of Stage N1 was 4.1%, Stage N2 was 31.9%, Stage N3 was 44.1% and Stage R (REM sleep) was 20.%.    RESPIRATORY ANALYSIS:  There was a total of 34 respiratory events: 20 obstructive apneas, 0 central apneas and 0 mixed apneas with a total of 20 apneas and an apnea index (AI) of 3. /hour. There were 14 hypopneas with a hypopnea index of 2.1/hour. The patient also had 0 respiratory event related arousals (RERAs).     The total APNEA/HYPOPNEA INDEX (AHI) was 5.2 /hour and the total RESPIRATORY DISTURBANCE INDEX was 5.2/ hr. 0 events occurred in REM sleep and 34 events in NREM. The REM AHI was 0.0 /hour versus a non-REM AHI of 6.3 /hour.  The patient spent 206.5 minutes of total sleep time in the supine position and 200 minutes in non-supine. The supine AHI was 7.8, versus a non-supine AHI of 2.1.  OXYGEN SATURATION & C02:  The baseline 02 saturation was 96%, with the lowest being 94%. Time spent below 89% saturation equaled 0 minutes.  PERIODIC LIMB MOVEMENTS:    The patient had a total of 180 Periodic Limb Movements. The Periodic Limb Movement (PLM) index was 26.6 and the PLM Arousal index was 1.3 /hour. The arousals were noted as: 20 were spontaneous, 9 were associated with PLMs, and 21 were associated with respiratory events.  Audio and video analysis did not show any abnormal or unusual movements, behaviors, phonations or vocalizations. The patient took bathroom breaks. Snoring was noted. EKG was in keeping with sinus rhythm. Post-study, the patient indicated that  sleep was the same as usual.    DIAGNOSIS 1.Obstructive Sleep Apnea, responsive to CPAP at 12 cm with 2 cm EPR, heated humidity and FFM, The patient was fitted with a Resmed Airtouch F20 - Large. 2. Periodic Limb Movement Disorder, moderate degree.  3. Atrial flutter was not noted.    PLANS/RECOMMENDATIONS: 1. CPAP therapy 2. Recommend that the patient avoid driving or operating hazardous machinery until obstructive sleep apnea has been treated and symptoms of daytime sleepiness are resolved.  The patient should be instructed  that, in any event, the patient should not drive if sleepy, and should pull off the road should he become sleepy while driving. a. Avoidance of medications with muscle relaxant properties, if applicable. b. Avoidance of ingestion of alcohol prior to sleeping. c. Avoiding sleeping in the supine position (on one's back). d. Improvement of nasal patency if indicated. 3. PLMS:  Exclude secondary factors for periodic limb movements of sleep.  Check ferritin, renal function, electrolytes, magnesium, calcium, B12, folate (treat deficiencies if present; serum ferritin values less than 50 mcg/l should be treated with iron replacement, along with appropriate investigation for causes of iron deficiency).  Exclude any history or physical examination data supporting peripheral neuropathy.   A follow up appointment will be scheduled in the Sleep Clinic at [facility].   Please call [phone] with any questions.      I certify that I have reviewed the entire raw data recording prior to the issuance of this report in accordance with the Standards of Accreditation of the American Academy of Sleep Medicine (AASM)   Melvyn Novas, M.D. 11-06-2016 Diplomat, American Board of Psychiatry and Neurology  Diplomat, American Board of Sleep Medicine Medical Director, [place] [facility]

## 2016-11-06 NOTE — Addendum Note (Signed)
Addended by: Melvyn Novas on: 11/06/2016 06:52 PM   Modules accepted: Orders

## 2016-11-07 ENCOUNTER — Telehealth: Payer: Self-pay | Admitting: Cardiology

## 2016-11-07 ENCOUNTER — Other Ambulatory Visit: Payer: Self-pay | Admitting: *Deleted

## 2016-11-07 ENCOUNTER — Telehealth: Payer: Self-pay | Admitting: Neurology

## 2016-11-07 DIAGNOSIS — I25119 Atherosclerotic heart disease of native coronary artery with unspecified angina pectoris: Secondary | ICD-10-CM

## 2016-11-07 NOTE — Telephone Encounter (Signed)
Fred Thompson, scheduler spoke with pt this afternoon.  Advised her to schedule lipid panel for pt. Will check with her tomorrow morning to see why this hasn't been scheduled.

## 2016-11-07 NOTE — Telephone Encounter (Signed)
-----   Message from Melvyn Novas, MD sent at 11/06/2016  6:52 PM EDT ----- Patient with OSA, needing 12 cm water pressure for CPAP. PLms were noted and need to be addressed if these don't resolve under CPAP therapy,. Patient has tolerated FFM best. Order written. CD report to Dr Jacinto Halim

## 2016-11-07 NOTE — Telephone Encounter (Signed)
I called Fred Thompson. I advised Fred Thompson that Dr. Vickey Huger reviewed their sleep study results and found that Fred Thompson does have sleep apnea. Dr. Vickey Huger recommends that Fred Thompson starts CPAP therapy. I reviewed PAP compliance expectations with the Fred Thompson. Fred Thompson is agreeable to starting a CPAP. I advised Fred Thompson that an order will be sent to a DME, [facility], and [facility] will call the Fred Thompson within about one week after they file with the Fred Thompson's insurance. [facility] will show the Fred Thompson how to use the machine, fit for masks, and troubleshoot the CPAP if needed.Fred Thompson was in a meeting and stated he would call me back to make sure we schedule a follow up visit. Will need to schedule his apt in November and it can be with Dr Vickey Huger or NP

## 2016-11-07 NOTE — Telephone Encounter (Signed)
New message    Pt calling back to schedule lab work?

## 2016-11-07 NOTE — Telephone Encounter (Signed)
Pt returned call and I was able to talk a little bit more in detail. Pt scheduled his follow up apt with Dr Vickey Huger on Feb 07 2017 at 10:00am. Will send pt a letter with all this information. Pt verbalized understanding

## 2016-11-08 NOTE — Telephone Encounter (Signed)
Scheduler informed me that pt was driving and not sure what date he can stop by for lab work. Pt will call office to schedule. Lab order in [facility]

## 2016-11-10 ENCOUNTER — Other Ambulatory Visit: Payer: Managed Care, Other (non HMO)

## 2016-11-10 DIAGNOSIS — I25119 Atherosclerotic heart disease of native coronary artery with unspecified angina pectoris: Secondary | ICD-10-CM

## 2016-11-10 LAB — LIPID PANEL
Chol/HDL Ratio: 5.8 ratio — ABNORMAL HIGH (ref 0.0–5.0)
Cholesterol, Total: 219 mg/dL — ABNORMAL HIGH (ref 100–199)
HDL: 38 mg/dL — ABNORMAL LOW (ref >39)
LDL Calculated: 164 mg/dL — ABNORMAL HIGH (ref 0–99)
Triglycerides: 83 mg/dL (ref 0–149)
VLDL CHOLESTEROL CAL: 17 mg/dL (ref 5–40)

## 2016-11-29 ENCOUNTER — Encounter: Payer: Self-pay | Admitting: Cardiology

## 2016-11-29 NOTE — Telephone Encounter (Signed)
New Message ° ° pt verbalized that he is returning call for rn  °

## 2016-11-30 NOTE — Telephone Encounter (Signed)
This encounter was created in error - please disregard.

## 2016-11-30 NOTE — Telephone Encounter (Signed)
New Message ° ° pt verbalized that he is returning call for rn  °

## 2016-12-26 ENCOUNTER — Encounter: Payer: Self-pay | Admitting: Internal Medicine

## 2016-12-26 ENCOUNTER — Ambulatory Visit (INDEPENDENT_AMBULATORY_CARE_PROVIDER_SITE_OTHER): Payer: 59 | Admitting: Internal Medicine

## 2016-12-26 ENCOUNTER — Ambulatory Visit (INDEPENDENT_AMBULATORY_CARE_PROVIDER_SITE_OTHER): Payer: Managed Care, Other (non HMO) | Admitting: Pharmacist

## 2016-12-26 VITALS — Wt 219.0 lb

## 2016-12-26 VITALS — BP 128/86 | HR 81 | Ht 77.0 in | Wt 219.0 lb

## 2016-12-26 DIAGNOSIS — E785 Hyperlipidemia, unspecified: Secondary | ICD-10-CM

## 2016-12-26 DIAGNOSIS — I493 Ventricular premature depolarization: Secondary | ICD-10-CM | POA: Diagnosis not present

## 2016-12-26 DIAGNOSIS — I480 Paroxysmal atrial fibrillation: Secondary | ICD-10-CM

## 2016-12-26 DIAGNOSIS — I4892 Unspecified atrial flutter: Secondary | ICD-10-CM

## 2016-12-26 MED ORDER — ROSUVASTATIN CALCIUM 10 MG PO TABS: 10.0000 mg | ORAL_TABLET | Freq: Every day | ORAL | 4 refills | Status: DC

## 2016-12-26 NOTE — Progress Notes (Signed)
HPI Fred Thompson returns today for ongoing evauation and management of atrial fib, s/p ablation, and to discuss his CT scan and overall fatigue. The patient was initially treated with flecainide and referred for catheter ablation which was carried out successfully several months ago. In the interim, he has had minimal if any palpitations and certainly no documented sustained atrial fibrillation. CT scanning prior to his ablation and striated atherosclerosis in his abdominal aorta as well as coronaries but nonobstructive in nature. The patient was ultimately referred for initiation of lipid therapy and returns today for additional evaluation and management. He initially noted that he felt well after his ablation but has had some increasing fatigue and weakness. He states that he has been busy at home and at work. He is remodeling his house, takes care of his 108-year-old granddaughter, and works approximately 60-65 hours a week. He wonders why he is energy level is low when he is not working. He has not had syncope. He denies chest pain or shortness of breath. Today I reviewed his CT scan which is described as above. Allergies  Allergen Reactions  . Atorvastatin Other (See Comments)    Fatigue, myalgias     Current Outpatient Prescriptions  Medication Sig Dispense Refill  . cetirizine (ZYRTEC) 10 MG tablet Take 10 mg by mouth daily as needed for allergies.    . Cholecalciferol (D3 SUPER STRENGTH) 2000 units CAPS Take 2,000 Units by mouth daily.    Marland Kitchen diltiazem (DILACOR XR) 180 MG 24 hr capsule Take 180 mg by mouth daily.    . finasteride (PROSCAR) 5 MG tablet Take 5 mg by mouth daily.    . vitamin E 400 UNIT capsule Take 400 Units by mouth daily.    Fred Thompson 20 MG TABS tablet Take 20 mg by mouth daily with supper.   3  . rosuvastatin (CRESTOR) 10 MG tablet Take 1 tablet (10 mg total) by mouth daily. 30 tablet 4   No current facility-administered medications for this visit.      Past  Medical History:  Diagnosis Date  . Allergic rhinitis 01/20/2016  . Bell's palsy   . BPH (benign prostatic hyperplasia) 01/01/2016  . Dyslipidemia (high LDL; low HDL) 01/01/2016  . 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 on diltiazem  . Hemorrhoids 01/20/2016  . LV dysfunction    a. echo 12/2015: EF 45-50%, mild global HK, normal diastolic function, mildly dilated LA, trace MR, trace TR, unable to estimate PASP  . Near syncope 01/01/2016  . PAF (paroxysmal atrial fibrillation) (HCC)    a. diagnosed in 12/2015; b. failed flecainide and multaq; c. CHADS2VASc => 1 (CHF); d. on xarelto; e. s/p successful Afib ablation 09/29/2016  . Paroxysmal atrial flutter (HCC) 01/02/2016    ROS:   All systems reviewed and negative except as noted in the HPI.   Past Surgical History:  Procedure Laterality Date  . APPENDECTOMY  1973  . ATRIAL FIBRILLATION ABLATION N/A 09/29/2016   Procedure: Atrial Fibrillation Ablation;  Surgeon: Regan Lemming, MD;  Location: [facility] INVASIVE CV LAB;  Service: Cardiovascular;  Laterality: N/A;  . KNEE SURGERY Right 1999     Family History  Problem Relation Age of Onset  . CAD Father        quad bypass @ 45  . Healthy Sister      Social History   Social History  . Marital status: Married    Spouse name: N/A  . Number of children: 1  .  Years of education: N/A   Occupational History  . Not on file.   Social History Main Topics  . Smoking status: Never Smoker  . Smokeless tobacco: Never Used  . Alcohol use Yes     Comment: occasional  . Drug use: Unknown  . Sexual activity: Not on file   Other Topics Concern  . Not on file   Social History Narrative  . No narrative on file     BP 128/86   Pulse 81   Ht 6\' 5"  (1.956 m)   Wt 219 lb (99.3 kg)   SpO2 99%   BMI 25.97 kg/m   Physical Exam:  Well appearing 56 year old man, NAD HEENT: Unremarkable Neck:  7 cm JVD, no thyromegally Lymphatics:  No adenopathy Back:  No CVA tenderness Lungs:  Clear,  with no wheezes, rales, or rhonchi. HEART:  Regular rate rhythm, no murmurs, no rubs, no clicks Abd:  soft, positive bowel sounds, no organomegally, no rebound, no guarding Ext:  2 plus pulses, no edema, no cyanosis, no clubbing Skin:  No rashes no nodules Neuro:  CN II through XII intact, motor grossly intact  Assess/Plan: 1. Atrial fibrillation - he appears to be maintaining sinus rhythm very nicely on no antiarrhythmic drug therapy. He will continue his calcium channel blocker. 2. Hypertension - his blood pressure is well controlled. He will continue his current medications and maintain a low-sodium diet. 3. Coronary artery atherosclerosis - he has been started on statin therapy. I would like him to follow-up with Dr. Jacinto Halim for additional up titration of his medications.  Lewayne Bunting, M.D.

## 2016-12-26 NOTE — Progress Notes (Signed)
Patient ID: URAL ACREE                 DOB: 27-Nov-1960                    MRN: [id number]     HPI: Fred Thompson is a 56 y.o. male patient referred to lipid clinic by Dr. Elberta Fortis. PMH is significant for PAF s/p ablation 09/29/2016, HLD, LV dysfunction EF 45-50%.   Patient presents today for first Rx clinic visit to discuss options for lipid-lowering agents. Per patient, was on brand name Lipitor first several years ago, unable to tolerate 2/2 mylagias therefore was switched to simvastatin 40 mg which he tolerated well. When he was started on diltiazem, simvastatin was d/c'd 2/2 DDI and atorvastatin was restarted. Myalgias returned and atorvastatin was stopped. Sx resolved within 1 week after atorvastatin was stopped.   Per patient, Dr. Anselm Jungling wants him back on a lipid-lowering medication and suggested Crestor. Patient amenable to trial of new statin.   Has [employer] and meds are affordable.   Current Medications: None Intolerances: atorvastatin - fatigue, joint pain in shoulders and hips- subsided within 1 week of cessation. Tolerated Simvastatin well.   Risk Factors: Father with h/o CAD - Bypass in 4s, never had an MI. Family hx negative for HTN and DM otherwise  LDL goal: 100mg /dL  Diet: remodeling house so diet is atypical. Trying to lose weight. Avoids canned foods. Does eat lunch meat and eats out for lunch often. Breakfast - chocolate milk and luna bar Lunch - sandwich or bowl of soup from restaurant; grilled cheese sandwich + bacon for lunch from [employer] - doesn't eat often Snacks - Peanut butter  Exercise: none at present, working in the yard, looking after grandchildren   Family History: Father with CAD  Social History: Never smoker, EtOH use ~once/month.   Labs: 11/10/2016: TC 219, TG 83, HDL 38, LDL 164 (no lipid lowering therapy)   Past Medical History:  Diagnosis Date  . Allergic rhinitis 01/20/2016  . Bell's palsy   . BPH (benign prostatic  hyperplasia) 01/01/2016  . Dyslipidemia (high LDL; low HDL) 01/01/2016  . 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 on diltiazem  . Hemorrhoids 01/20/2016  . LV dysfunction    a. echo 12/2015: EF 45-50%, mild global HK, normal diastolic function, mildly dilated LA, trace MR, trace TR, unable to estimate PASP  . Near syncope 01/01/2016  . PAF (paroxysmal atrial fibrillation) (HCC)    a. diagnosed in 12/2015; b. failed flecainide and multaq; c. CHADS2VASc => 1 (CHF); d. on xarelto; e. s/p successful Afib ablation 09/29/2016  . Paroxysmal atrial flutter (HCC) 01/02/2016    Current Outpatient Prescriptions on File Prior to Visit  Medication Sig Dispense Refill  . cetirizine (ZYRTEC) 10 MG tablet Take 10 mg by mouth daily as needed for allergies.    . Cholecalciferol (D3 SUPER STRENGTH) 2000 units CAPS Take 2,000 Units by mouth daily.    Marland Kitchen diltiazem (DILACOR XR) 180 MG 24 hr capsule Take 180 mg by mouth daily.    . finasteride (PROSCAR) 5 MG tablet Take 5 mg by mouth daily.    . vitamin E 400 UNIT capsule Take 400 Units by mouth daily.    Carlena Hurl 20 MG TABS tablet Take 20 mg by mouth daily with supper.   3   No current facility-administered medications on file prior to visit.     Allergies  Allergen Reactions  . Atorvastatin  Other (See Comments)    Fatigue, myalgias    Assessment/Plan:  1. Hyperlipidemia - LDL above goal of 100mg /dL for primary prevention with strong family history, currently on no medication.  Patient willing to try new statin medication.  -Start rosuvastatin 10 mg PO once daily. Patient educated on purpose, proper use and potential adverse effects of rosuvastatin.  Following instruction patient verbalized understanding of treatment plan.  -Fasting lipid panel in ~3 months   Fred Thompson, Pharm.D. PGY2 [facility] Pharm[facility] Resident Phone: [phone]

## 2016-12-26 NOTE — Patient Instructions (Addendum)
Thank you for coming in today!   1. Start Crestor 10 mg once daily.  2. Lipid panel is scheduled for 04/06/2017 at 7:30AM  Call with any questions.

## 2016-12-26 NOTE — Patient Instructions (Addendum)
Medication Instructions:  Your physician recommends that you continue on your current medications as directed. Please refer to the Current Medication list given to you today.  Labwork: None ordered.  Testing/Procedures: None ordered.  Follow-Up: Your physician recommends that you schedule a follow-up appointment as needed.   Any Other Special Instructions Will Be Listed Below (If Applicable).     If you need a refill on your cardiac medications before your next appointment, please call your pharmacy.   

## 2017-01-07 NOTE — Progress Notes (Signed)
Electrophysiology Office Note   Date:  01/08/2017   ID:  Fred Thompson, DOB [birth year], MRN [id number]  PCP:  Darrow BussingKoirala, [name], MD  Cardiologist:  Colan Neptuneaylor, Ganji Primary Electrophysiologist:  Reesa Gotschall Jorja LoaMartin Edelmiro Innocent, MD    Chief Complaint  Patient presents with  . Follow-up    PAF     History of Present Illness: Eual FinesGary M Logue is a 56 y.o. male who is being seen today for the evaluation of atrial fibrillation/flutter at the request of [name], [name], MD. Presenting today for electrophysiology evaluation.  He was initially diagnosed with atrial fibrillation in October 2017. Initial symptoms were dizziness, diaphoresis, or syncope. He was a fundraiser time taking in the hospital was diagnosed. He was since placed on flecainide, and did not tolerate this medication due to fatigue. I Ariea Rochin switch to multaq, and he continues to have pain amounts of fatigue. He has had nuclear stress tests, which apparently been normal, and an echo showed an EF of 45-50%. Had AF ablation 09/29/16.   Today, denies symptoms of palpitations, chest pain, shortness of breath, orthopnea, PND, lower extremity edema, claudication, dizziness, presyncope, syncope, bleeding, or neurologic sequela. The patient is tolerating medications without difficulties. He has continued to have episodes of fatigue and weakness. He is in sinus rhythm today. He's been having difficulty doing his daily activities. He was told to walk for 30 minutes a day which has not yet made a difference. This was 2 weeks ago. He does not note any further atrial fibrillation symptoms, though he was minimally symptomatic before.    Past Medical History:  Diagnosis Date  . Allergic rhinitis 01/20/2016  . Bell's palsy   . BPH (benign prostatic hyperplasia) 01/01/2016  . Dyslipidemia (high LDL; low HDL) 01/01/2016  . 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 on diltiazem  . Hemorrhoids 01/20/2016  . LV dysfunction    a. echo 12/2015: EF 45-50%, mild global HK, normal  diastolic function, mildly dilated LA, trace MR, trace TR, unable to estimate PASP  . Near syncope 01/01/2016  . PAF (paroxysmal atrial fibrillation) (HCC)    a. diagnosed in 12/2015; b. failed flecainide and multaq; c. CHADS2VASc => 1 (CHF); d. on xarelto; e. s/p successful Afib ablation 09/29/2016  . Paroxysmal atrial flutter (HCC) 01/02/2016   Past Surgical History:  Procedure Laterality Date  . APPENDECTOMY  1973  . ATRIAL FIBRILLATION ABLATION N/A 09/29/2016   Procedure: Atrial Fibrillation Ablation;  Surgeon: Regan Lemmingamnitz, Anaka Beazer Martin, MD;  [facility];  Service: Cardiovascular;  Laterality: N/A;  . KNEE SURGERY Right 1999     Current Outpatient Prescriptions  Medication Sig Dispense Refill  . cetirizine (ZYRTEC) 10 MG tablet Take 10 mg by mouth daily as needed for allergies.    . Cholecalciferol (D3 SUPER STRENGTH) 2000 units CAPS Take 2,000 Units by mouth daily.    Marland Kitchen. diltiazem (DILACOR XR) 180 MG 24 hr capsule Take 180 mg by mouth daily.    . finasteride (PROSCAR) 5 MG tablet Take 5 mg by mouth daily.    . rosuvastatin (CRESTOR) 10 MG tablet Take 1 tablet (10 mg total) by mouth daily. 30 tablet 4  . vitamin E 400 UNIT capsule Take 400 Units by mouth daily.     No current facility-administered medications for this visit.     Allergies:   Atorvastatin   Social History:  The patient  reports that he has never smoked. He has never used smokeless tobacco. He reports that he drinks alcohol.  Family History:  The patient's family history includes CAD in his father; Healthy in his sister.    ROS:  Please see the history of present illness.   Otherwise, review of systems is positive for DOE, blood in stool, difficulty urinating.   All other systems are reviewed and negative.   PHYSICAL EXAM: VS:  BP 124/82   Pulse 80   Ht 6\' 5"  (1.956 m)   Wt 221 lb 9.6 oz (100.5 kg)   SpO2 98%   BMI 26.28 kg/m  , BMI Body mass index is 26.28 kg/m. GEN: Well nourished, well  developed, in no acute distress  HEENT: normal  Neck: no JVD, carotid bruits, or masses Cardiac: RRR; no murmurs, rubs, or gallops,no edema  Respiratory:  clear to auscultation bilaterally, normal work of breathing GI: soft, nontender, nondistended, + BS MS: no deformity or atrophy  Skin: warm and dry Neuro:  Strength and sensation are intact Psych: euthymic mood, full affect  EKG:  EKG is ordered today. Personal review of the ekg ordered shows sinus rhythm, rate 80  Recent Labs: 09/29/2016: BUN 10; Creatinine, Ser 1.00; Potassium 3.8; Sodium 137 09/30/2016: Hemoglobin 11.2; Platelets 181    Lipid Panel     Component Value Date/Time   CHOL 219 (H) 11/10/2016 0831   TRIG 83 11/10/2016 0831   HDL 38 (L) 11/10/2016 0831   CHOLHDL 5.8 (H) 11/10/2016 0831   LDLCALC 164 (H) 11/10/2016 0831     Wt Readings from Last 3 Encounters:  01/08/17 221 lb 9.6 oz (100.5 kg)  12/26/16 219 lb (99.3 kg)  12/26/16 219 lb (99.3 kg)      Other studies Reviewed: Additional studies/ records that were reviewed today include: ETT 05/15/16  Review of the above records today demonstrates:    Blood pressure demonstrated a hypertensive response to exercise. 1 mm upsloping ST depression in inferior leads with exercise Non diagnostic due to achieving onlyh 82% of PMHR PVC;s and bigemminy with stress HTN response to exercise   TTE 01/17/17 Mild global hypokinesis, EF 45-50%, mildly dilated left atrium 4.1 cm  ASSESSMENT AND PLAN:  1.  Atrial fibrillation/flutter: On Xarelto. Had AF ablation 09/29/16. Continued to have episodes of fatigue. We'll fit him with a 48 hour monitor to further determine if he is having atrial fibrillation or other arrhythmias. His symptoms do occur every day. Due to his low chads score, Heyden Jaber plan to stop his Xarelto. I discussed the possibility of a Linq implantation, though Norena Bratton defer at this time.  This patients CHA2DS2-VASc Score and unadjusted Ischemic Stroke Rate (%  per year) is equal to 0.6 % stroke rate/year from a score of 1  Above score calculated as 1 point each if present [CHF, HTN, DM, Vascular=MI/PAD/Aortic Plaque, Age if 65-74, or Male] Above score calculated as 2 points each if present [Age > 75, or Stroke/TIA/TE]  2. PVCs: No PVCs on EKG today. Continue current management.  3. Mild LV dysfunction: Plan per Dr.Ganji. Euvolemic today.  Current medicines are reviewed at length with the patient today.   The patient does not have concerns regarding his medicines.  The following changes were made today:  none  Labs/ tests ordered today include:  Orders Placed This Encounter  Procedures  . Holter monitor - 48 hour  . EKG 12-Lead     Disposition:   FU with Tajay Muzzy 3 month  Signed, Oona Trammel Jorja Loa, MD  01/08/2017 11:47 AM     [facility] [address]  [address] [place] [address] [phone] (office) [phone] (fax)

## 2017-01-08 ENCOUNTER — Encounter: Payer: Self-pay | Admitting: Cardiology

## 2017-01-08 ENCOUNTER — Encounter (INDEPENDENT_AMBULATORY_CARE_PROVIDER_SITE_OTHER): Payer: Self-pay

## 2017-01-08 ENCOUNTER — Ambulatory Visit (INDEPENDENT_AMBULATORY_CARE_PROVIDER_SITE_OTHER): Payer: 59 | Admitting: Cardiology

## 2017-01-08 VITALS — BP 124/82 | HR 80 | Ht 77.0 in | Wt 221.6 lb

## 2017-01-08 DIAGNOSIS — I428 Other cardiomyopathies: Secondary | ICD-10-CM | POA: Diagnosis not present

## 2017-01-08 DIAGNOSIS — I493 Ventricular premature depolarization: Secondary | ICD-10-CM

## 2017-01-08 DIAGNOSIS — Z8679 Personal history of other diseases of the circulatory system: Secondary | ICD-10-CM

## 2017-01-08 DIAGNOSIS — Z9889 Other specified postprocedural states: Secondary | ICD-10-CM | POA: Diagnosis not present

## 2017-01-08 DIAGNOSIS — I480 Paroxysmal atrial fibrillation: Secondary | ICD-10-CM

## 2017-01-08 NOTE — Patient Instructions (Addendum)
Medication Instructions:  Your physician has recommended you make the following change in your medication:  1. STOP XARELTO  Labwork: None ordered  Testing/Procedures: Your physician has recommended that you wear a 48 hour holter monitor. Holter monitors are medical devices that record the heart's electrical activity. Doctors most often use these monitors to diagnose arrhythmias. Arrhythmias are problems with the speed or rhythm of the heartbeat. The monitor is a small, portable device. You can wear one while you do your normal daily activities. This is usually used to diagnose what is causing palpitations/syncope (passing out).  Follow-Up: Your physician recommends that you schedule a follow-up appointment in: 3 months with Dr. Elberta Fortis.  -- If you need a refill on your cardiac medications before your next appointment, please call your pharmacy. --  Thank you for choosing [facility]!!   Dory Horn, RN [phone]  Any Other Special Instructions Will Be Listed Below (If Applicable).  Holter Monitoring A Holter monitor is a small device that is used to detect abnormal heart rhythms. It clips to your clothing and is connected by wires to flat, sticky disks (electrodes) that attach to your chest. It is worn continuously for 24-48 hours. Follow these instructions at home:  Wear your Holter monitor at all times, even while exercising and sleeping, for as long as directed by your health care provider.  Make sure that the Holter monitor is safely clipped to your clothing or close to your body as recommended by your health care provider.  Do not get the monitor or wires wet.  Do not put body lotion or moisturizer on your chest.  Keep your skin clean.  Keep a diary of your daily activities, such as walking and doing chores. If you feel that your heartbeat is abnormal or that your heart is fluttering or skipping a beat: ? Record what you are doing when it happens. ? Record what  time of day the symptoms occur.  Return your Holter monitor as directed by your health care provider.  Keep all follow-up visits as directed by your health care provider. This is important. Get help right away if:  You feel lightheaded or you faint.  You have trouble breathing.  You feel pain in your chest, upper arm, or jaw.  You feel sick to your stomach and your skin is pale, cool, or damp.  You heartbeat feels unusual or abnormal. This information is not intended to replace advice given to you by your health care provider. Make sure you discuss any questions you have with your health care provider. Document Released: 12/03/2003 Document Revised: 08/12/2015 Document Reviewed: 10/13/2013 Elsevier Interactive Patient Education  [employer].

## 2017-01-12 ENCOUNTER — Ambulatory Visit (INDEPENDENT_AMBULATORY_CARE_PROVIDER_SITE_OTHER): Payer: 59

## 2017-01-12 DIAGNOSIS — I480 Paroxysmal atrial fibrillation: Secondary | ICD-10-CM

## 2017-01-18 ENCOUNTER — Telehealth: Payer: Self-pay

## 2017-01-18 NOTE — Telephone Encounter (Signed)
   [facility] Pre-operative Risk Assessment    Request for surgical clearance:  1. What type of surgery is being performed? Colonoscopy    2. When is this surgery scheduled? 02-05-2017    3. Are there any medications that need to be held prior to surgery and how long? Xarelto   4. Practice name and name of physician performing surgery? [facility] - Dr. Oletta Lamas   5. What is your office phone and fax number? Phone: [phone] - Fax: [phone]   6. Anesthesia type (None, local, MAC, general) ? n/a  ____________________________________________   (provider comments below) Patient has hx of colon polyps and rectal bleeding.

## 2017-01-18 NOTE — Telephone Encounter (Signed)
    Chart reviewed as part of pre-operative protocol coverage.   Pt with history of cardiomyopathy, PAF/flutter, nonobstructive coronary disease by cardiac CT 09/2016 with calcium score 89th percentile. Patient was just seen by Dr. Elberta Fortis within last 10 days at which time he was having continued fatigue and weakness; event monitor showed no recurrent atrial fib or significant arrhythmia. I will forward to Dr. Elberta Fortis to get his input on cardiac clearance for colonoscopy and holding Xarelto. Will, can you please forward your reply to P CV [facility] PREOP? Thanks.   Laurann [place], PA-C  01/18/2017, 3:42 PM

## 2017-01-19 NOTE — Telephone Encounter (Signed)
Fred Thompson had an atrial fibrillation ablation in July 2018. He needs to be on 3 months uninterrupted anticoagulation and thus has done that. Would be able to be off anticoagulation 2 days prior to his procedure. Would restart as soon as possible. He is at intermediate risk for a low risk procedure.  Loman Brooklyn, MD

## 2017-01-19 NOTE — Telephone Encounter (Signed)
Faxed to [facility] via Epic.

## 2017-02-07 ENCOUNTER — Ambulatory Visit: Payer: Self-pay | Admitting: Neurology

## 2017-02-14 ENCOUNTER — Encounter: Payer: Self-pay | Admitting: Cardiology

## 2017-02-28 ENCOUNTER — Encounter: Payer: Self-pay | Admitting: Neurology

## 2017-03-05 ENCOUNTER — Ambulatory Visit (INDEPENDENT_AMBULATORY_CARE_PROVIDER_SITE_OTHER): Payer: 59 | Admitting: Neurology

## 2017-03-05 ENCOUNTER — Encounter: Payer: Self-pay | Admitting: Neurology

## 2017-03-05 VITALS — BP 130/78 | HR 68 | Ht 77.0 in | Wt 229.0 lb

## 2017-03-05 DIAGNOSIS — I480 Paroxysmal atrial fibrillation: Secondary | ICD-10-CM | POA: Diagnosis not present

## 2017-03-05 DIAGNOSIS — Z9989 Dependence on other enabling machines and devices: Secondary | ICD-10-CM

## 2017-03-05 DIAGNOSIS — G4733 Obstructive sleep apnea (adult) (pediatric): Secondary | ICD-10-CM | POA: Insufficient documentation

## 2017-03-05 DIAGNOSIS — Z87898 Personal history of other specified conditions: Secondary | ICD-10-CM | POA: Diagnosis not present

## 2017-03-05 NOTE — Progress Notes (Signed)
SLEEP MEDICINE CLINIC   Provider:  Melvyn Novas, [place] D  Primary Care Physician:  [name], MD [facility].    Referring Provider: Dr Jacinto Halim.   HPI:  Fred Thompson is a 56 y.o. male , seen here as in a referral  from Dr. Jacinto Halim to to follow up on a HST , 03-05-2017. I have the pleasure of meeting with Fred Thompson today following up on his home sleep test, the patient who had endorsed the Epworth Sleepiness Scale at 15 points prior to his home sleep test now endorses the Epworth score at 6 points.  The home sleep test was performed on 18 September 2016, it recorded 7 hours and 41 minutes activity.  He had a really mild apnea index of 5.6 but his respiratory disturbance index was 9.2 indicating that he was snoring.  The total desaturation time was only 1 minute, heart rate varied greatly between 40 and 107 bpm I recommended CPAP patient is a optimization. The patient then was followed by a in lab titration on 25 October 2016 and did excellent at 12 cmH2O was 2 cm EPR he was fitted, with a ResMed air touch fullface mask enlarged.  He also did have some periodic limb movement in moderate degree and we did not see atrial flutter while he was under CPAP treatment.  He is now here with a compliance download for the last 30 days which is excellent 97% compliance 7 hours and 33 minutes of average use of time, onset pressure 1230 be devoid of a 3 cm EPR residual AHI is 2.1.  Based on this I would not need to make any changes and I can clearly see that the patient benefited from CPAP therapy given that his Epworth score has been reduced by more than 50%.He remains on diltiazem for rate control, had an ablation , and has worn a monitor for one weekend (48 hours) negative for a fib.  Side benefit: he has no nocturia any longer ! Used [place] 3 times at night.     I have the pleasure of seeing Fred Thompson today on 08/09/2016, referred by his cardiologist with an underlying diagnosis of atrial  fibrillation and atrial flutter.  The patient reports snoring, rhinitis and has not been known to have apnea.  The patient has a past medical history of Bell's palsy on the left, had frequent PVCs, an echocardiogram from 01/18/2016 showed normal left ventricle size mild decrease in systolic function and mild global hypokinesis. Ejection fraction is 45-50% with a normal diastolic filling pattern the left atrium is mildly dilated. He has a trace of mitral regurgitation and a trace of tricuspid regurgitation as well. Paroxysmal atrial flutter was diagnosed in October 2017 and followed up with an exercise tolerance stress test on 05/15/2016, he achieved only 82% of PMH. At the time of the exercise tolerance stress test the patient had been taken generic Lipitor and he developed myositis, aching and was unable to exercise. He states that he was even unable to shrug his shoulders or walk a flight of stairs. There were  PVCs bigemini's-  with stress he had been clearly in atrial fibrillation in November 2017, when he was seen by Dr. Sharrell Ku. He has an incomplete right bundle branch block. Paroxysmal episodes of atrial flutter with variable atrioventricular conduction and rapid ventricular response with a diagnosis. The follow-up stress test was done as a nuclear study and reportedly normal.    Chief complaint according to patient :  The patient reported that he feels tired and fatigued, and attributes this mainly to the heart medication rather than the irregular heart rhythm. He also has nocturia up to 3 times at night.  Sleep habits are as follows: The patient goes to bed by 9:30 PM, his wife prefers to watch the TV on but he turns the volume down. It seems not to affect his sleep that the TV is in the room. The bedroom is otherwise cool and quiet. He sleeps on his side. He sleeps on one pillow only. He uses a flat mattress top. His sleep will be interrupted 2-3 times at night by the urge to urinate. He rises  at 5:00 AM. He has an over 1 hour commute [place] to [place]. He denies any sleep choking, shortness of breath, diaphoresis or palpitations. He wakes without headaches. He feels usually not ready to start his day and not restored or refreshed but has always done so. In total he will gets about 6- 7 hours of sleep at night.  Sleep medical history and family sleep history:  No family History of OSA, he was a sleep walker.   Social history: The patient is married with a 349 year old daughter, 2 grandchildren. [place] He commutes long distance. Not a shift worker. He has no history of tobacco use, he drinks less than 3 alcoholic beverages per week. He does not drink coffee tea and he does not drink soft drinks. No caffeine intake. 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   Review of Systems: Out of a complete 14 system review, the patient complains of only the following symptoms, and all other reviewed systems are negative. How likely are you to doze in the following situations: 0 = not likely, 1 = slight chance, 2 = moderate chance, 3 = high chance.   Epworth score  6 from 15- , Fatigue severity score 47  , depression score 1/ 15   Social History   Socioeconomic History  . Marital status: Married    Spouse name: Not on file  . Number of children: 1  . Years of education: Not on file  . Highest education level: Not on file  Social Needs  . Financial resource strain: Not on file  . Food insecurity - worry: Not on file  . Food insecurity - inability: Not on file  . Transportation needs - medical: Not on file  . Transportation needs - non-medical: Not on file  Occupational History  . Not on file  Tobacco Use  . Smoking status: Never Smoker  . Smokeless tobacco: Never Used  Substance and Sexual Activity  . Alcohol use: Yes    Comment: occasional  . Drug use: Not on file  . Sexual activity: Not on file  Other Topics Concern  . Not on file  Social History Narrative   . Not on file    Family History  Problem Relation Age of Onset  . CAD Father        quad bypass @ 4368  . Healthy Sister     Past Medical History:  Diagnosis Date  . Allergic rhinitis 01/20/2016  . Bell's palsy   . BPH (benign prostatic hyperplasia) 01/01/2016  . Dyslipidemia (high LDL; low HDL) 01/01/2016  . 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 on diltiazem  . Hemorrhoids 01/20/2016  . LV dysfunction    a. echo 12/2015: EF 45-50%, mild global HK, normal diastolic function, mildly dilated LA, trace MR, trace TR, unable to estimate  PASP  . Near syncope 01/01/2016  . PAF (paroxysmal atrial fibrillation) (HCC)    a. diagnosed in 12/2015; b. failed flecainide and multaq; c. CHADS2VASc => 1 (CHF); d. on xarelto; e. s/p successful Afib ablation 09/29/2016  . Paroxysmal atrial flutter (HCC) 01/02/2016    Past Surgical History:  Procedure Laterality Date  . APPENDECTOMY  1973  . ATRIAL FIBRILLATION ABLATION N/A 09/29/2016   Procedure: Atrial Fibrillation Ablation;  Surgeon: Regan Lemming, MD;  [facility] INVASIVE CV LAB;  Service: Cardiovascular;  Laterality: N/A;  . KNEE SURGERY Right 1999    Current Outpatient Medications  Medication Sig Dispense Refill  . cetirizine (ZYRTEC) 10 MG tablet Take 10 mg by mouth daily as needed for allergies.    . Cholecalciferol (D3 SUPER STRENGTH) 2000 units CAPS Take 2,000 Units by mouth daily.    Marland Kitchen diltiazem (DILACOR XR) 180 MG 24 hr capsule Take 180 mg by mouth daily.    . finasteride (PROSCAR) 5 MG tablet Take 5 mg by mouth daily.    . rosuvastatin (CRESTOR) 10 MG tablet Take 1 tablet (10 mg total) by mouth daily. 30 tablet 4  . vitamin E 400 UNIT capsule Take 400 Units by mouth daily.     No current facility-administered medications for this visit.     Allergies as of 03/05/2017 - Review Complete 01/08/2017  Allergen Reaction Noted  . Atorvastatin Other (See Comments) 07/12/2010    Vitals: There were no vitals taken for this  visit. Last Weight:  Wt Readings from Last 1 Encounters:  01/08/17 221 lb 9.6 oz (100.5 kg)   QZR:AQTMA is no height or weight on file to calculate BMI.     Last Height:   Ht Readings from Last 1 Encounters:  01/08/17 6\' 5"  (1.956 m)    Physical exam:  General: The patient is awake, alert and appears not in acute distress. The patient is well groomed. Head: Normocephalic, atraumatic. Neck is supple. Mallampati 4,  neck circumference:17.5 . Nasal airflow rhinitis. TMJ is  Not evident . Retrognathia is seen.  Cardiovascular:  Regular rate and rhythm, without  murmurs or carotid bruit, and without distended neck veins. Respiratory: Lungs are clear to auscultation. Skin:  Without evidence of edema, or rash Trunk: BMI is normal . The patient's posture is erect   Neurologic exam : Pupils are equal and briskly reactive to light. Funduscopic exam without evidence of pallor or edema.  Extraocular movements  in vertical and horizontal planes intact and without nystagmus. Visual fields by finger perimetry are intact. Hearing to finger rub intact.   Facial sensation intact to fine touch.  Facial motor strength is asymmetric, with a mild left Bell's residue - but  tongue and uvula move midline. Shoulder shrug was symmetrical.   Motor exam:   Normal tone, muscle bulk and symmetric strength in all extremities. Sensory:  normal.Coordination: Finger-to-nose maneuver  normal without evidence of ataxia, dysmetria or tremor. Gait and station: Patient walks without assistive device and is able unassisted to climb up to the exam table. Strength within normal limits. Stance is stable and normal. Turns with 3 Steps. Deep tendon reflexes: in the  upper and lower extremities are symmetric and intact.   Assessment:    Dear Dr. Jacinto Halim,  After physical and neurologic examination, review of laboratory studies, testing and pre-existing records as far as provided in visit, my assessment is   1) Fred Thompson cardiac  condition and irregular heartbeat with rapid ventricular responses may correlate to  the presence of sleep apnea, also unbeknownst to him. I will order a split night polysomnography in the lab, and parallel order a home sleep test as well. I am less suspicious that the patient may suffer from hypoxemia, as is neither morbidly obese nor does he have normal muscle tone. He also does not report waking up with morning headaches.  The patient was advised of the nature of the diagnosed disorder , the treatment options and the  risks for general health and wellness arising from not treating the condition.   I spent more than 15 minutes of face to face time with the patient. Greater than 50% of time was spent in counseling and coordination of care. We have discussed the diagnosis and differential and I answered the patient's questions.    Plan:  Treatment plan and additional workup : Rv in 12 month .    Melvyn Novas, MD 03/05/2017, 8:04 AM  Certified in Neurology by ABPN Certified in Sleep Medicine by [facility] [address], [address] [place], [place] [address]

## 2017-03-05 NOTE — Patient Instructions (Signed)

## 2017-04-06 ENCOUNTER — Encounter (INDEPENDENT_AMBULATORY_CARE_PROVIDER_SITE_OTHER): Payer: Self-pay

## 2017-04-06 ENCOUNTER — Other Ambulatory Visit: Payer: 59 | Admitting: *Deleted

## 2017-04-06 DIAGNOSIS — E785 Hyperlipidemia, unspecified: Secondary | ICD-10-CM

## 2017-04-06 LAB — LIPID PANEL
CHOL/HDL RATIO: 3.1 ratio (ref 0.0–5.0)
Cholesterol, Total: 144 mg/dL (ref 100–199)
HDL: 47 mg/dL (ref >39)
LDL CALC: 82 mg/dL (ref 0–99)
Triglycerides: 74 mg/dL (ref 0–149)
VLDL CHOLESTEROL CAL: 15 mg/dL (ref 5–40)

## 2017-04-06 LAB — HEPATIC FUNCTION PANEL
ALT: 26 IU/L (ref 0–44)
AST: 22 IU/L (ref 0–40)
Albumin: 4.4 g/dL (ref 3.5–5.5)
Alkaline Phosphatase: 77 IU/L (ref 39–117)
Bilirubin Total: 0.3 mg/dL (ref 0.0–1.2)
Bilirubin, Direct: 0.08 mg/dL (ref 0.00–0.40)
TOTAL PROTEIN: 7.2 g/dL (ref 6.0–8.5)

## 2017-04-13 ENCOUNTER — Ambulatory Visit: Payer: 59 | Admitting: Cardiology

## 2017-04-18 ENCOUNTER — Ambulatory Visit (INDEPENDENT_AMBULATORY_CARE_PROVIDER_SITE_OTHER): Payer: 59 | Admitting: Cardiology

## 2017-04-18 ENCOUNTER — Encounter: Payer: Self-pay | Admitting: Cardiology

## 2017-04-18 VITALS — BP 136/86 | HR 82 | Ht 77.0 in | Wt 228.0 lb

## 2017-04-18 DIAGNOSIS — I480 Paroxysmal atrial fibrillation: Secondary | ICD-10-CM

## 2017-04-18 DIAGNOSIS — I428 Other cardiomyopathies: Secondary | ICD-10-CM

## 2017-04-18 DIAGNOSIS — I493 Ventricular premature depolarization: Secondary | ICD-10-CM | POA: Diagnosis not present

## 2017-04-18 NOTE — Progress Notes (Signed)
Electrophysiology Office Note   Date:  04/18/2017   ID:  Jacub, Waiters 1960/05/09, MRN [id number]  PCP:  [name], MD  Cardiologist:  Colan Neptune Primary Electrophysiologist:  Will Jorja Loa, MD    Chief Complaint  Patient presents with  . Follow-up    PAF     History of Present Illness: Fred Thompson is a 57 y.o. male who is being seen today for the evaluation of atrial fibrillation/flutter at the request of [name], [name], MD. Presenting today for electrophysiology evaluation.  He was initially diagnosed with atrial fibrillation in October 2017. Initial symptoms were dizziness, diaphoresis, or syncope. He was a fundraiser time taking in the hospital was diagnosed. He was since placed on flecainide, and did not tolerate this medication due to fatigue. I will switch to multaq, and he continues to have pain amounts of fatigue. He has had nuclear stress tests, which apparently been normal, and an echo showed an EF of 45-50%. Had AF ablation 09/29/16.  Today, denies symptoms of palpitations, chest pain, shortness of breath, orthopnea, PND, lower extremity edema, claudication, dizziness, presyncope, syncope, bleeding, or neurologic sequela. The patient is tolerating medications without difficulties.  He is currently feeling well without major complaint.  He has not had any further palpitations since last being seen.  He is planning on getting back into an exercise regimen.   Past Medical History:  Diagnosis Date  . Allergic rhinitis 01/20/2016  . Bell's palsy   . BPH (benign prostatic hyperplasia) 01/01/2016  . Dyslipidemia (high LDL; low HDL) 01/01/2016  . 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 on diltiazem  . Hemorrhoids 01/20/2016  . LV dysfunction    a. echo 12/2015: EF 45-50%, mild global HK, normal diastolic function, mildly dilated LA, trace MR, trace TR, unable to estimate PASP  . Near syncope 01/01/2016  . PAF (paroxysmal atrial fibrillation) (HCC)    a. diagnosed  in 12/2015; b. failed flecainide and multaq; c. CHADS2VASc => 1 (CHF); d. on xarelto; e. s/p successful Afib ablation 09/29/2016  . Paroxysmal atrial flutter (HCC) 01/02/2016   Past Surgical History:  Procedure Laterality Date  . APPENDECTOMY  1973  . ATRIAL FIBRILLATION ABLATION N/A 09/29/2016   Procedure: Atrial Fibrillation Ablation;  Surgeon: Regan Lemming, MD;  Location: [facility] INVASIVE CV LAB;  Service: Cardiovascular;  Laterality: N/A;  . KNEE SURGERY Right 1999     Current Outpatient Medications  Medication Sig Dispense Refill  . cetirizine (ZYRTEC) 10 MG tablet Take 10 mg by mouth daily as needed for allergies.    . Cholecalciferol (D3 SUPER STRENGTH) 2000 units CAPS Take 2,000 Units by mouth daily.    Marland Kitchen diltiazem (DILACOR XR) 180 MG 24 hr capsule Take 180 mg by mouth daily.    . finasteride (PROSCAR) 5 MG tablet Take 5 mg by mouth daily.    . rosuvastatin (CRESTOR) 10 MG tablet Take 1 tablet (10 mg total) by mouth daily. 30 tablet 4  . vitamin E 400 UNIT capsule Take 400 Units by mouth daily.     No current facility-administered medications for this visit.     Allergies:   Atorvastatin   Social History:  The patient  reports that  has never smoked. he has never used smokeless tobacco. He reports that he drinks alcohol.   Family History:  The patient's family history includes CAD in his father; Healthy in his sister.   ROS:  Please see the history of present illness.   Otherwise, review  of systems is positive for none.   All other systems are reviewed and negative.   PHYSICAL EXAM: VS:  BP 136/86   Pulse 82   Ht 6\' 5"  (1.956 m)   Wt 228 lb (103.4 kg)   BMI 27.04 kg/m  , BMI Body mass index is 27.04 kg/m. GEN: Well nourished, well developed, in no acute distress  HEENT: normal  Neck: no JVD, carotid bruits, or masses Cardiac: RRR; no murmurs, rubs, or gallops,no edema  Respiratory:  clear to auscultation bilaterally, normal work of breathing GI: soft, nontender,  nondistended, + BS MS: no deformity or atrophy  Skin: warm and dry Neuro:  Strength and sensation are intact Psych: euthymic mood, full affect  EKG:  EKG is not ordered today. Personal review of the ekg ordered 01/08/17 shows SR, rate 80   Recent Labs: 09/29/2016: BUN 10; Creatinine, Ser 1.00; Potassium 3.8; Sodium 137 09/30/2016: Hemoglobin 11.2; Platelets 181 04/06/2017: ALT 26    Lipid Panel     Component Value Date/Time   CHOL 144 04/06/2017 0738   TRIG 74 04/06/2017 0738   HDL 47 04/06/2017 0738   CHOLHDL 3.1 04/06/2017 0738   LDLCALC 82 04/06/2017 0738     Wt Readings from Last 3 Encounters:  04/18/17 228 lb (103.4 kg)  03/05/17 229 lb (103.9 kg)  01/08/17 221 lb 9.6 oz (100.5 kg)      Other studies Reviewed: Additional studies/ records that were reviewed today include: ETT 05/15/16  Review of the above records today demonstrates:    Blood pressure demonstrated a hypertensive response to exercise. 1 mm upsloping ST depression in inferior leads with exercise Non diagnostic due to achieving onlyh 82% of PMHR PVC;s and bigemminy with stress HTN response to exercise   TTE 01/17/17 Mild global hypokinesis, EF 45-50%, mildly dilated left atrium 4.1 cm  Holter 01/18/17 - personally reviewed Minimum HR: 71 BPM at 9:54:24 PM Maximum HR: 134 BPM at 12:56:24 PM(2) Average HR: 86 BPM <1% PVCs, <1% APCs 6 beat run of likely atrial tachycardia Sinus rhythm without other arrhythmia  ASSESSMENT AND PLAN:  1.  Atrial fibrillation/flutter: On no anticoagulation due to a low stroke risk.  Has remained in sinus rhythm since ablation.  No changes at this time.  This patients CHA2DS2-VASc Score and unadjusted Ischemic Stroke Rate (% per year) is equal to 0.6 % stroke rate/year from a score of 1  Above score calculated as 1 point each if present [CHF, HTN, DM, Vascular=MI/PAD/Aortic Plaque, Age if 65-74, or Male] Above score calculated as 2 points each if present [Age > 75,  or Stroke/TIA/TE]  2. PVCs: Low burden on recent Holter monitor.  Continue with current management.  3. Mild LV dysfunction: Plan per Dr. Eulogio Bear.  He is going to have an echocardiogram today.  Current medicines are reviewed at length with the patient today.   The patient does not have concerns regarding his medicines.  The following changes were made today: None  Labs/ tests ordered today include:  No orders of the defined types were placed in this encounter.    Disposition:   FU with Will Camnitz 6 month  Signed, Will Jorja Loa, MD  04/18/2017 10:21 AM     [facility] [address] [place] [address] [phone] (office) [phone] (fax)

## 2017-04-18 NOTE — Patient Instructions (Signed)
Medication Instructions:  Your physician recommends that you continue on your current medications as directed. Please refer to the Current Medication list given to you today.  If you need a refill on your cardiac medications before your next appointment, please call your pharmacy.   Labwork: None ordered  Testing/Procedures: None ordered  Follow-Up: Your physician wants you to follow-up in: 6 months with Dr. Camnitz.  You will receive a reminder letter in the mail two months in advance. If you don't receive a letter, please call our office to schedule the follow-up appointment.  Thank you for choosing [facility]!!   Ethelene Closser, RN [phone]         

## 2017-05-08 ENCOUNTER — Ambulatory Visit: Payer: 59

## 2017-05-08 ENCOUNTER — Other Ambulatory Visit: Payer: 59

## 2017-05-17 ENCOUNTER — Other Ambulatory Visit: Payer: Self-pay | Admitting: Cardiology

## 2017-05-17 DIAGNOSIS — E785 Hyperlipidemia, unspecified: Secondary | ICD-10-CM

## 2017-09-17 HISTORY — PX: CATARACT EXTRACTION: SUR2

## 2017-10-30 ENCOUNTER — Other Ambulatory Visit: Payer: Self-pay | Admitting: Cardiology

## 2017-10-30 DIAGNOSIS — E785 Hyperlipidemia, unspecified: Secondary | ICD-10-CM

## 2017-12-17 ENCOUNTER — Ambulatory Visit: Payer: 59 | Admitting: Cardiology

## 2017-12-26 ENCOUNTER — Encounter: Payer: Self-pay | Admitting: Cardiology

## 2017-12-26 ENCOUNTER — Ambulatory Visit (INDEPENDENT_AMBULATORY_CARE_PROVIDER_SITE_OTHER): Payer: 59 | Admitting: Cardiology

## 2017-12-26 VITALS — BP 124/68 | HR 74 | Ht 77.0 in | Wt 232.0 lb

## 2017-12-26 DIAGNOSIS — I480 Paroxysmal atrial fibrillation: Secondary | ICD-10-CM

## 2017-12-26 DIAGNOSIS — I428 Other cardiomyopathies: Secondary | ICD-10-CM

## 2017-12-26 DIAGNOSIS — I493 Ventricular premature depolarization: Secondary | ICD-10-CM | POA: Diagnosis not present

## 2017-12-26 DIAGNOSIS — I483 Typical atrial flutter: Secondary | ICD-10-CM | POA: Diagnosis not present

## 2017-12-26 NOTE — Progress Notes (Signed)
Electrophysiology Office Note   Date:  12/26/2017   ID:  Dickie, Rebelo [birth year], MRN [id number]  PCP:  [name], MD  Cardiologist:  Colan Neptune Primary Electrophysiologist:  Will Jorja Loa, MD    No chief complaint on file.    History of Present Illness: Fred Thompson is a 57 y.o. male who is being seen today for the evaluation of atrial fibrillation/flutter at the request of [name], [name], MD. Presenting today for electrophysiology evaluation.  He was initially diagnosed with atrial fibrillation in October 2017. Initial symptoms were dizziness, diaphoresis, or syncope. He was a fundraiser time taking in the hospital was diagnosed. He was since placed on flecainide, and did not tolerate this medication due to fatigue. I will switch to multaq, and he continues to have pain amounts of fatigue. He has had nuclear stress tests, which apparently been normal, and an echo showed an EF of 45-50%. Had AF ablation 09/29/16.  Today, denies symptoms of palpitations, chest pain, shortness of breath, orthopnea, PND, lower extremity edema, claudication, dizziness, presyncope, syncope, bleeding, or neurologic sequela. The patient is tolerating medications without difficulties.  Well.  He is noted no further episodes of atrial fibrillation.  Over the summer, he had cataract surgery.  His operation got infected and is since been wearing an eye patch.  He has a repeat surgery in November.   Past Medical History:  Diagnosis Date  . Allergic rhinitis 01/20/2016  . Bell's palsy   . BPH (benign prostatic hyperplasia) 01/01/2016  . Dyslipidemia (high LDL; low HDL) 01/01/2016  . 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 on diltiazem  . Hemorrhoids 01/20/2016  . LV dysfunction    a. echo 12/2015: EF 45-50%, mild global HK, normal diastolic function, mildly dilated LA, trace MR, trace TR, unable to estimate PASP  . Near syncope 01/01/2016  . PAF (paroxysmal atrial fibrillation) (HCC)    a.  diagnosed in 12/2015; b. failed flecainide and multaq; c. CHADS2VASc => 1 (CHF); d. on xarelto; e. s/p successful Afib ablation 09/29/2016  . Paroxysmal atrial flutter (HCC) 01/02/2016   Past Surgical History:  Procedure Laterality Date  . APPENDECTOMY  1973  . ATRIAL FIBRILLATION ABLATION N/A 09/29/2016   Procedure: Atrial Fibrillation Ablation;  Surgeon: Regan Lemming, MD;  Location: [facility] INVASIVE CV LAB;  Service: Cardiovascular;  Laterality: N/A;  . KNEE SURGERY Right 1999     Current Outpatient Medications  Medication Sig Dispense Refill  . cetirizine (ZYRTEC) 10 MG tablet Take 10 mg by mouth daily as needed for allergies.    . Cholecalciferol (D3 SUPER STRENGTH) 2000 units CAPS Take 2,000 Units by mouth daily.    Marland Kitchen diltiazem (DILACOR XR) 180 MG 24 hr capsule Take 180 mg by mouth daily.    . finasteride (PROSCAR) 5 MG tablet Take 5 mg by mouth daily.    . rosuvastatin (CRESTOR) 10 MG tablet TAKE 1 TABLET BY MOUTH EVERY DAY 90 tablet 1  . vitamin E 400 UNIT capsule Take 400 Units by mouth daily.     No current facility-administered medications for this visit.     Allergies:   Atorvastatin   Social History:  The patient  reports that he has never smoked. He has never used smokeless tobacco. He reports that he drinks alcohol.   Family History:  The patient's family history includes CAD in his father; Healthy in his sister.   ROS:  Please see the history of present illness.   Otherwise, review of systems  is positive for visual disturbance.   All other systems are reviewed and negative.   PHYSICAL EXAM: VS:  BP 124/68   Pulse 74   Ht 6\' 5"  (1.956 m)   Wt 232 lb (105.2 kg)   BMI 27.51 kg/m  , BMI Body mass index is 27.51 kg/m. GEN: Well nourished, well developed, in no acute distress  HEENT: normal  Neck: no JVD, carotid bruits, or masses Cardiac: RRR; no murmurs, rubs, or gallops,no edema  Respiratory:  clear to auscultation bilaterally, normal work of breathing GI:  soft, nontender, nondistended, + BS MS: no deformity or atrophy  Skin: warm and dry Neuro:  Strength and sensation are intact Psych: euthymic mood, full affect  EKG:  EKG is ordered today. Personal review of the ekg ordered shows this rhythm, rate 74  Recent Labs: 04/06/2017: ALT 26    Lipid Panel     Component Value Date/Time   CHOL 144 04/06/2017 0738   TRIG 74 04/06/2017 0738   HDL 47 04/06/2017 0738   CHOLHDL 3.1 04/06/2017 0738   LDLCALC 82 04/06/2017 0738     Wt Readings from Last 3 Encounters:  12/26/17 232 lb (105.2 kg)  04/18/17 228 lb (103.4 kg)  03/05/17 229 lb (103.9 kg)      Other studies Reviewed: Additional studies/ records that were reviewed today include: ETT 05/15/16  Review of the above records today demonstrates:    Blood pressure demonstrated a hypertensive response to exercise. 1 mm upsloping ST depression in inferior leads with exercise Non diagnostic due to achieving onlyh 82% of PMHR PVC;s and bigemminy with stress HTN response to exercise   TTE 01/17/17 Mild global hypokinesis, EF 45-50%, mildly dilated left atrium 4.1 cm  Holter 01/18/17 - personally reviewed Minimum HR: 71 BPM at 9:54:24 PM Maximum HR: 134 BPM at 12:56:24 PM(2) Average HR: 86 BPM <1% PVCs, <1% APCs 6 beat run of likely atrial tachycardia Sinus rhythm without other arrhythmia  ASSESSMENT AND PLAN:  1.  Atrial fibrillation/flutter: No anti-coagulation.  He is remained in sinus rhythm since ablation.  No changes. This patients CHA2DS2-VASc Score and unadjusted Ischemic Stroke Rate (% per year) is equal to 0.6 % stroke rate/year from a score of 1  Above score calculated as 1 point each if present [CHF, HTN, DM, Vascular=MI/PAD/Aortic Plaque, Age if 65-74, or Male] Above score calculated as 2 points each if present [Age > 75, or Stroke/TIA/TE]   2. PVCs: Low burden on monitor.  Continue with current management  3. Mild LV dysfunction: Plan per Dr.  Jacinto Halim.  Current medicines are reviewed at length with the patient today.   The patient does not have concerns regarding his medicines.  The following changes were made today: none  Labs/ tests ordered today include:  Orders Placed This Encounter  Procedures  . EKG 12-Lead     Disposition:   FU with Will Camnitz 12 month  Signed, Will Jorja Loa, MD  12/26/2017 4:51 PM     [facility] [address] [place] [address] [phone] (office) [phone] (fax)

## 2017-12-26 NOTE — Patient Instructions (Signed)
Medication Instructions:  Your physician recommends that you continue on your current medications as directed. Please refer to the Current Medication list given to you today.  If you need a refill on your cardiac medications before your next appointment, please call your pharmacy.   Lab work: None ordered  Testing/Procedures: None ordered  Follow-Up: At [facility], you and your health needs are our priority.  As part of our continuing mission to provide you with exceptional heart care, we have created designated Provider Care Teams.  These Care Teams include your primary Cardiologist (physician) and Advanced Practice Providers (APPs -  Physician Assistants and Nurse Practitioners) who all work together to provide you with the care you need, when you need it. You will need a follow up appointment in 1 year.  Please call our office 2 months in advance to schedule this appointment.  You may see Will Martin Camnitz, MD or one of the following Advanced Practice Providers on your designated Care Team:   Amber Seiler, NP . Renee Ursuy, PA-C  Thank you for choosing [facility]!!   Sherri Price, RN [phone]    \   

## 2018-02-03 ENCOUNTER — Encounter: Payer: Self-pay | Admitting: Adult Health

## 2018-02-28 ENCOUNTER — Ambulatory Visit: Payer: 59 | Admitting: Neurology

## 2018-03-07 ENCOUNTER — Encounter: Payer: Self-pay | Admitting: Adult Health

## 2018-03-07 ENCOUNTER — Ambulatory Visit (INDEPENDENT_AMBULATORY_CARE_PROVIDER_SITE_OTHER): Payer: 59 | Admitting: Adult Health

## 2018-03-07 ENCOUNTER — Ambulatory Visit: Payer: 59 | Admitting: Adult Health

## 2018-03-07 VITALS — BP 129/83 | HR 75 | Ht 77.0 in | Wt 227.0 lb

## 2018-03-07 DIAGNOSIS — G4733 Obstructive sleep apnea (adult) (pediatric): Secondary | ICD-10-CM | POA: Diagnosis not present

## 2018-03-07 DIAGNOSIS — Z9989 Dependence on other enabling machines and devices: Secondary | ICD-10-CM | POA: Diagnosis not present

## 2018-03-07 NOTE — Progress Notes (Signed)
PATIENT: Fred Thompson DOB: Jan 09, 1961  REASON FOR VISIT: follow up HISTORY FROM: patient  HISTORY OF PRESENT ILLNESS: Today 03/07/18:  Fred Thompson is a 57 year old male with a history of obstructive sleep apnea on CPAP.  His download indicates that he uses machine 27 out of 30 days for compliance of 90%.  He uses machine greater than 4 hours 24 days for compliance of 80%.  On average he uses his machine 6 hours and 27 minutes.  His residual AHI is 1.2 on 12 cm of water with EPR 3.  He does not have a significant leak.  He returns today for evaluation.  HISTORY (Copied from Dr.Dohmeier's note)  Fred Thompson is a 57 y.o. male , seen here as in a referral  from Dr. Jacinto Halim to to follow up on a HST , 03-05-2017. I have the pleasure of meeting with Fred Thompson today following up on his home sleep test, the patient who had endorsed the Epworth Sleepiness Scale at 15 points prior to his home sleep test now endorses the Epworth score at 6 points.  The home sleep test was performed on 18 September 2016, it recorded 7 hours and 41 minutes activity.  He had a really mild apnea index of 5.6 but his respiratory disturbance index was 9.2 indicating that he was snoring.  The total desaturation time was only 1 minute, heart rate varied greatly between 40 and 107 bpm I recommended CPAP patient is a optimization. The patient then was followed by a in lab titration on 25 October 2016 and did excellent at 12 cmH2O was 2 cm EPR he was fitted, with a ResMed air touch fullface mask enlarged.  He also did have some periodic limb movement in moderate degree and we did not see atrial flutter while he was under CPAP treatment.  He is now here with a compliance download for the last 30 days which is excellent 97% compliance 7 hours and 33 minutes of average use of time, onset pressure 1230 be devoid of a 3 cm EPR residual AHI is 2.1.  Based on this I would not need to make any changes and I can clearly see that the patient  benefited from CPAP therapy given that his Epworth score has been reduced by more than 50%.He remains on diltiazem for rate control, had an ablation , and has worn a monitor for one weekend (48 hours) negative for a fib.  Side benefit: he has no nocturia any longer ! Used [place] 3 times at night.     I have the pleasure of seeing Fred Thompson today on 08/09/2016, referred by his cardiologist with an underlying diagnosis of atrial fibrillation and atrial flutter.  The patient reports snoring, rhinitis and has not been known to have apnea.  The patient has a past medical history of Bell's palsy on the left, had frequent PVCs, an echocardiogram from 01/18/2016 showed normal left ventricle size mild decrease in systolic function and mild global hypokinesis. Ejection fraction is 45-50% with a normal diastolic filling pattern the left atrium is mildly dilated. He has a trace of mitral regurgitation and a trace of tricuspid regurgitation as well. Paroxysmal atrial flutter was diagnosed in October 2017 and followed up with an exercise tolerance stress test on 05/15/2016, he achieved only 82% of PMH. At the time of the exercise tolerance stress test the patient had been taken generic Lipitor and he developed myositis, aching and was unable to exercise. He states that he  was even unable to shrug his shoulders or walk a flight of stairs. There were  PVCs bigemini's-  with stress he had been clearly in atrial fibrillation in November 2017, when he was seen by Dr. Sharrell Ku. He has an incomplete right bundle branch block. Paroxysmal episodes of atrial flutter with variable atrioventricular conduction and rapid ventricular response with a diagnosis. The follow-up stress test was done as a nuclear study and reportedly normal.    Chief complaint according to patient : The patient reported that he feels tired and fatigued, and attributes this mainly to the heart medication rather than the irregular heart rhythm.  He also has nocturia up to 3 times at night.  Sleep habits are as follows: The patient goes to bed by 9:30 PM, his wife prefers to watch the TV on but he turns the volume down. It seems not to affect his sleep that the TV is in the room. The bedroom is otherwise cool and quiet. He sleeps on his side. He sleeps on one pillow only. He uses a flat mattress top. His sleep will be interrupted 2-3 times at night by the urge to urinate. He rises at 5:00 AM. He has an over 1 hour commute every morning to [place], [place]. He denies any sleep choking, shortness of breath, diaphoresis or palpitations. He wakes without headaches. He feels usually not ready to start his day and not restored or refreshed but has always done so. In total he will gets about 6- 7 hours of sleep at night.  Sleep medical history and family sleep history:  No family History of OSA, he was a sleep walker.   Social history: The patient is married with a 51 year old daughter, 2 grandchildren. Lives in [place]. He commutes long distance. Not a shift worker. He has no history of tobacco use, he drinks less than 3 alcoholic beverages per week. He does not drink coffee tea and he does not drink soft drinks. No caffeine intake. 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   REVIEW OF SYSTEMS: Out of a complete 14 system review of symptoms, the patient complains only of the following symptoms, and all other reviewed systems are negative.  Epworth sleepiness score 8  ALLERGIES: Allergies  Allergen Reactions  . Atorvastatin Other (See Comments)    Fatigue, myalgias Other reaction(s): Other (See Comments) Fatigue, myalgias    HOME MEDICATIONS: Outpatient Medications Prior to Visit  Medication Sig Dispense Refill  . Cholecalciferol (D3 SUPER STRENGTH) 2000 units CAPS Take 2,000 Units by mouth daily.    Marland Kitchen diltiazem (DILACOR XR) 180 MG 24 hr capsule Take 180 mg by mouth daily.    . finasteride (PROSCAR) 5 MG tablet Take 5 mg by mouth daily.     . rosuvastatin (CRESTOR) 10 MG tablet TAKE 1 TABLET BY MOUTH EVERY DAY 90 tablet 1  . vitamin E 400 UNIT capsule Take 400 Units by mouth daily.    . cetirizine (ZYRTEC) 10 MG tablet Take 10 mg by mouth daily as needed for allergies.     No facility-administered medications prior to visit.     PAST MEDICAL HISTORY: Past Medical History:  Diagnosis Date  . Allergic rhinitis 01/20/2016  . Bell's palsy   . BPH (benign prostatic hyperplasia) 01/01/2016  . Dyslipidemia (high LDL; low HDL) 01/01/2016  . 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 on diltiazem  . Hemorrhoids 01/20/2016  . LV dysfunction    a. echo 12/2015: EF 45-50%, mild global HK, normal diastolic function, mildly dilated  LA, trace MR, trace TR, unable to estimate PASP  . Near syncope 01/01/2016  . PAF (paroxysmal atrial fibrillation) (HCC)    a. diagnosed in 12/2015; b. failed flecainide and multaq; c. CHADS2VASc => 1 (CHF); d. on xarelto; e. s/p successful Afib ablation 09/29/2016  . Paroxysmal atrial flutter (HCC) 01/02/2016    PAST SURGICAL HISTORY: Past Surgical History:  Procedure Laterality Date  . APPENDECTOMY  1973  . ATRIAL FIBRILLATION ABLATION N/A 09/29/2016   Procedure: Atrial Fibrillation Ablation;  Surgeon: Regan Lemming, MD;  Location: [facility] INVASIVE CV LAB;  Service: Cardiovascular;  Laterality: N/A;  . CATARACT EXTRACTION Right 09/2017   has had infection, then oil bubble, the gas bubble 02/06/2018  . KNEE SURGERY Right 1999    FAMILY HISTORY: Family History  Problem Relation Age of Onset  . CAD Father        quad bypass @ 42  . Healthy Sister     SOCIAL HISTORY: Social History   Socioeconomic History  . Marital status: Married    Spouse name: Not on file  . Number of children: 1  . Years of education: Not on file  . Highest education level: Not on file  Occupational History  . Not on file  Social Needs  . Financial resource strain: Not on file  . Food insecurity:    Worry: Not on file     Inability: Not on file  . Transportation needs:    Medical: Not on file    Non-medical: Not on file  Tobacco Use  . Smoking status: Never Smoker  . Smokeless tobacco: Never Used  Substance and Sexual Activity  . Alcohol use: Yes    Comment: occasional  . Drug use: Not on file  . Sexual activity: Not on file  Lifestyle  . Physical activity:    Days per week: Not on file    Minutes per session: Not on file  . Stress: Not on file  Relationships  . Social connections:    Talks on phone: Not on file    Gets together: Not on file    Attends religious service: Not on file    Active member of club or organization: Not on file    Attends meetings of clubs or organizations: Not on file    Relationship status: Not on file  . Intimate partner violence:    Fear of current or ex partner: Not on file    Emotionally abused: Not on file    Physically abused: Not on file    Forced sexual activity: Not on file  Other Topics Concern  . Not on file  Social History Narrative  . Not on file      PHYSICAL EXAM  Vitals:   03/07/18 1446  BP: 129/83  Pulse: 75  Weight: 227 lb (103 kg)  Height: 6\' 5"  (1.956 m)   Body mass index is 26.92 kg/m.  Generalized: Well developed, in no acute distress   Neurological examination  Mentation: Alert oriented to time, place, history taking. Follows all commands speech and language fluent Cranial nerve II-XII:  Extraocular movements were full, visual field were full on confrontational test. Facial sensation and strength were normal. Uvula tongue midline. Head turning and shoulder shrug  were normal and symmetric.  His neck circumference was 17-1/2 inches, Mallampati 3+ Motor: The motor testing reveals 5 over 5 strength of all 4 extremities. Good symmetric motor tone is noted throughout.  Sensory: Sensory testing is intact to soft touch on  all 4 extremities. No evidence of extinction is noted.  Gait and station: Gait is normal.    DIAGNOSTIC DATA  (LABS, IMAGING, TESTING) - I reviewed patient records, labs, notes, testing and imaging myself where available.  Lab Results  Component Value Date   WBC 5.6 09/30/2016   HGB 11.2 (L) 09/30/2016   HCT 34.7 (L) 09/30/2016   MCV 86.1 09/30/2016   PLT 181 09/30/2016      Component Value Date/Time   NA 137 09/29/2016 1028   K 3.8 09/29/2016 1028   CL 107 09/29/2016 1028   CO2 24 09/29/2016 1028   GLUCOSE 98 09/29/2016 1028   BUN 10 09/29/2016 1028   CREATININE 1.00 09/29/2016 1028   CALCIUM 8.7 (L) 09/29/2016 1028   PROT 7.2 04/06/2017 0738   ALBUMIN 4.4 04/06/2017 0738   AST 22 04/06/2017 0738   ALT 26 04/06/2017 0738   ALKPHOS 77 04/06/2017 0738   BILITOT 0.3 04/06/2017 0738   GFRNONAA >60 09/29/2016 1028   GFRAA >60 09/29/2016 1028   Lab Results  Component Value Date   CHOL 144 04/06/2017   HDL 47 04/06/2017   LDLCALC 82 04/06/2017   TRIG 74 04/06/2017   CHOLHDL 3.1 04/06/2017   No results found for: HGBA1C No results found for: VITAMINB12 No results found for: TSH    ASSESSMENT AND PLAN 57 y.o. year old male  has a past medical history of Allergic rhinitis (01/20/2016), Bell's palsy, BPH (benign prostatic hyperplasia) (01/01/2016), Dyslipidemia (high LDL; low HDL) (01/01/2016), Frequent PVCs (01/02/2016), Hemorrhoids (01/20/2016), LV dysfunction, Near syncope (01/01/2016), PAF (paroxysmal atrial fibrillation) (HCC), and Paroxysmal atrial flutter (HCC) (01/02/2016). here with:  1.  Obstructive sleep apnea on CPAP  The patient CPAP download shows excellent compliance and good treatment of his apnea.  He is encouraged to continue using the CPAP nightly and greater than 4 hours each night.  He will follow-up in 1 year or sooner if needed.   I spent 15 minutes with the patient. 50% of this time was spent reviewing CPAP download   Butch PennyMegan Aaima Gaddie, MSN, NP-C 03/07/2018, 3:09 PM [facility] [address], [address] [place], [place] [address] [phone]  [phone]

## 2018-03-07 NOTE — Patient Instructions (Signed)

## 2018-04-26 ENCOUNTER — Other Ambulatory Visit: Payer: Self-pay | Admitting: Cardiology

## 2018-04-26 DIAGNOSIS — E785 Hyperlipidemia, unspecified: Secondary | ICD-10-CM

## 2018-05-03 ENCOUNTER — Ambulatory Visit: Payer: Self-pay | Admitting: Cardiology

## 2018-05-03 ENCOUNTER — Encounter: Payer: Self-pay | Admitting: Cardiology

## 2018-05-03 ENCOUNTER — Ambulatory Visit (INDEPENDENT_AMBULATORY_CARE_PROVIDER_SITE_OTHER): Payer: 59 | Admitting: Cardiology

## 2018-05-03 VITALS — BP 128/85 | HR 63 | Ht 77.0 in | Wt 224.0 lb

## 2018-05-03 DIAGNOSIS — I420 Dilated cardiomyopathy: Secondary | ICD-10-CM

## 2018-05-03 DIAGNOSIS — Z9889 Other specified postprocedural states: Secondary | ICD-10-CM | POA: Diagnosis not present

## 2018-05-03 DIAGNOSIS — G4733 Obstructive sleep apnea (adult) (pediatric): Secondary | ICD-10-CM | POA: Diagnosis not present

## 2018-05-03 DIAGNOSIS — Z8679 Personal history of other diseases of the circulatory system: Secondary | ICD-10-CM

## 2018-05-03 MED ORDER — DILTIAZEM HCL ER 120 MG PO CP24: 120.0000 mg | ORAL_CAPSULE | Freq: Every day | ORAL | 2 refills | Status: DC

## 2018-05-03 NOTE — Progress Notes (Signed)
Subjective:  Primary Physician:  [name], MD  Patient ID: Fred Thompson, male    DOB: [birth year], 58 y.o.   MRN: [id number]  Chief Complaint  Patient presents with  . Cardiomyopathy    1 year f/u   . Follow-up    HPI: Fred Thompson  is a 58 y.o. male  with paroxysmal atrial fibrillation, paroxysmal atrial flutter who initially presented with syncope on 01/01/2016. He was unable to tolerate flecainide, Multac, due to marked fatigue and also efficacy, underwent atrial fibrillation ablation on 09/29/2016. He has noticed significant improvement in his energy levels since ablation.   No palpitations. No shortness of breath. He does walk daily without exertional difficulties. Has sleep apnea on CPAP. He just returned from a trip to [place] and had no issues with exertion, denies any palpitations, overall feeling well.  Essentially asymptomatic.  Past Medical History:  Diagnosis Date  . Allergic rhinitis 01/20/2016  . Bell's palsy   . BPH (benign prostatic hyperplasia) 01/01/2016  . Dyslipidemia (high LDL; low HDL) 01/01/2016  . 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 on diltiazem  . Hemorrhoids 01/20/2016  . LV dysfunction    a. echo 12/2015: EF 45-50%, mild global HK, normal diastolic function, mildly dilated LA, trace MR, trace TR, unable to estimate PASP  . Near syncope 01/01/2016  . PAF (paroxysmal atrial fibrillation) (HCC)    a. diagnosed in 12/2015; b. failed flecainide and multaq; c. CHADS2VASc => 1 (CHF); d. on xarelto; e. s/p successful Afib ablation 09/29/2016  . Paroxysmal atrial flutter (HCC) 01/02/2016  . PVC (premature ventricular contraction)     Past Surgical History:  Procedure Laterality Date  . APPENDECTOMY  1973  . ATRIAL FIBRILLATION ABLATION N/A 09/29/2016   Procedure: Atrial Fibrillation Ablation;  Surgeon: Regan Lemming, MD;  Location: [facility] INVASIVE CV LAB;  Service: Cardiovascular;  Laterality: N/A;  . CATARACT EXTRACTION Right 09/2017   has had  infection, then oil bubble, the gas bubble 02/06/2018  . KNEE SURGERY Right 1999    Social History   Socioeconomic History  . Marital status: Married    Spouse name: Not on file  . Number of children: 1  . Years of education: Not on file  . Highest education level: Not on file  Occupational History  . Not on file  Social Needs  . Financial resource strain: Not on file  . Food insecurity:    Worry: Not on file    Inability: Not on file  . Transportation needs:    Medical: Not on file    Non-medical: Not on file  Tobacco Use  . Smoking status: Never Smoker  . Smokeless tobacco: Never Used  Substance and Sexual Activity  . Alcohol use: Yes    Comment: occasional  . Drug use: Not on file  . Sexual activity: Not on file  Lifestyle  . Physical activity:    Days per week: Not on file    Minutes per session: Not on file  . Stress: Not on file  Relationships  . Social connections:    Talks on phone: Not on file    Gets together: Not on file    Attends religious service: Not on file    Active member of club or organization: Not on file    Attends meetings of clubs or organizations: Not on file    Relationship status: Not on file  . Intimate partner violence:    Fear of current or ex partner: Not  on file    Emotionally abused: Not on file    Physically abused: Not on file    Forced sexual activity: Not on file  Other Topics Concern  . Not on file  Social History Narrative  . Not on file    Current Outpatient Medications on File Prior to Visit  Medication Sig Dispense Refill  . brimonidine (ALPHAGAN) 0.2 % ophthalmic solution Place 1 drop into both eyes daily.    . Cholecalciferol (D3 SUPER STRENGTH) 2000 units CAPS Take 2,000 Units by mouth daily.    Marland Kitchen diltiazem (DILACOR XR) 180 MG 24 hr capsule Take 180 mg by mouth daily.    . dorzolamide-timolol (COSOPT) 22.3-6.8 MG/ML ophthalmic solution Place 1 drop into the right eye 2 (two) times daily.    . finasteride  (PROSCAR) 5 MG tablet Take 5 mg by mouth daily.    . rosuvastatin (CRESTOR) 10 MG tablet TAKE 1 TABLET BY MOUTH EVERY DAY 90 tablet 1  . vitamin E 400 UNIT capsule Take 400 Units by mouth daily.     No current facility-administered medications on file prior to visit.      Review of Systems  Constitutional: Negative for malaise/fatigue and weight loss.  Respiratory: Negative for cough, hemoptysis and shortness of breath.   Cardiovascular: Negative for chest pain, palpitations, claudication and leg swelling.  Gastrointestinal: Negative for abdominal pain, blood in stool, constipation, heartburn and vomiting.  Genitourinary: Negative for dysuria.  Musculoskeletal: Negative for joint pain and myalgias.  Neurological: Negative for dizziness, focal weakness and headaches.  Endo/Heme/Allergies: Does not bruise/bleed easily.  Psychiatric/Behavioral: Negative for depression. The patient is not nervous/anxious.   All other systems reviewed and are negative.      Objective:  Blood pressure 128/85, pulse 63, height 6\' 5"  (1.956 m), weight 224 lb (101.6 kg), SpO2 97 %. Body mass index is 26.56 kg/m.   Physical Exam  Constitutional: He appears well-developed and well-nourished. No distress.  HENT:  Head: Atraumatic.  Eyes: Conjunctivae are normal.  Neck: Neck supple. No JVD present. No thyromegaly present.  Cardiovascular: Normal rate, regular rhythm, normal heart sounds and intact distal pulses. Exam reveals no gallop.  No murmur heard. Pulmonary/Chest: Effort normal and breath sounds normal.  Abdominal: Soft. Bowel sounds are normal.  Musculoskeletal: Normal range of motion.        General: No edema.  Neurological: He is alert.  Skin: Skin is warm and dry.  Psychiatric: He has a normal mood and affect.   CARDIAC STUDIES:  Echocardiogram 04/18/2017: Left ventricle cavity is normal in size. Mild concentric hypertrophy of the left ventricle. Mild decrease in global wall motion. Visual  EF is 45-50%. Doppler evidence of grade I (impaired) diastolic dysfunction, normal LAP.  Left atrial cavity is moderately dilated. IVC is dilated with blunted respiratory response. This may suggest elevated right atrial pressure. Compared to prior study in 07/2016, marginal improvement in LVEF. Frequent PVC's not seen on this study.  Exercise sestamibi stress test 07/14/2016: 1. Resting EKG demonstrates atypical atrial flutter with variable ventricular response.  Stress EKG was negative for myocardial ischemia.  Patient appeared to have developed atrial fibrillation with peak exercise which reverted to atypical atrial flutter versus coarse atrial fibrillation in recovery.  Rare PVC.  Nonspecific ST segment changes noted at both rest and stress.  Patient exercised for 7:30 minutes and achieved 10.35 METs.  Normal blood pressure response. 2. Left ventricular cavity is noted to be enlarged on the rest and stress studies and  measured 142 mL.  SPECT images demonstrate homogeneous tracer distribution throughout the myocardium.  Gated SPECT imaging demonstrates global hypokinesis. The left ventricular ejection fraction was calculated or visually estimated to be 32%.  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 This is an intermediate risk study, clinical correlation recommended.  Assessment & Recommendations:   1. Dilated cardiomyopathy (HCC) I suspect his dilated cardiomyopathy was related to underlying atrial fibrillation.  He is been maintaining sinus rhythm for the past 2 years since atrial fibrillation ablation.  I'll repeat echocardiogram, I suspect his dilated cardio myopathy is resolved now, physical exam completely normal and is essentially asymptomatic.  EKG 05/03/2018: Normal sinus rhythm at the rate of 64 bpm, normal axis.  No evidence of ischemia, IVCD, probably normal variant.  Borderline criteria for LVH.  2. H/O atrial fibrillation without current medication. Now off anticoagulants  since ablation as his CHADS was 1.  3. S/P ablation of atrial fibrillation   4. Frequent PVCs prior to A. FIb ablation, now resolved  5. Obstructive sleep apnea on CPAP and compliant and follows Dr. Vickey Huger.Sleep apnea is improved since weight loss, he used to weigh 240 pounds, now he weighs around 222 225 pounds. 6.  Hyperlipidemia, lipids previously well controlled a year ago, I'll request his PCP to take oh.  Laboratory exam:  Labs 04/06/2017: LFTs normal.  Total cholesterol 144, triglycerides 74, HDL 47, LDL 82.  01/02/2016: Creatinine 0.95, potassium 4.1, troponin negative 2, CBC normal, magnesium 2.0, TSH 0.974  Recommendation: I'll repeat echocardiogram, if abnormal, I'll see him back, however is stable and no change. I will see him on a p.r.n. basis.    I reduced the dose of diltiazem from 180 mg to 120 mg daily as he does not have any history of hypertension.  This can be discontinued on his next office visit with his PCP if he is feeling well.  I'll be happy to see him back on a p.r.n. basis.  Yates Decamp, MD, [facility] 05/03/2018, 4:12 PM [facility] Pager: [phone] Office: [phone] If no answer Cell [phone]

## 2018-05-03 NOTE — Progress Notes (Deleted)
Subjective:  Primary Physician:  [name], MD  Patient ID: Fred Thompson, male    DOB: June 12, 1960, 58 y.o.   MRN: [id number]  No chief complaint on file.   HPI: Fred Thompson  is a 58 y.o. male  with atrial fibrillation. Patient with paroxysmal atrial fibrillation, paroxysmal atrial flutter who initially presented with syncope on 01/01/2016 when he had the 1st episode. He was unable to tolerate flecainide, Multac, due to marked fatigue and also efficacy. He eventually underwent atrial fibrillation ablation on 09/29/2016. He has noticed significant improvement in his energy levels since ablation.   Patient presents for 6 months follow up. He is presently doing well. He has lost approximatley 25 lbs since last seen by [place]. Now off of Xarelto has he underwent successful ablation on 09/29/2016. No palpitations. No shortness of breath. He does walk daily without exertional difficulties. Has sleep apnea on CPAP.  Past Medical History:  Diagnosis Date  . Allergic rhinitis 01/20/2016  . Bell's palsy   . BPH (benign prostatic hyperplasia) 01/01/2016  . Dyslipidemia (high LDL; low HDL) 01/01/2016  . 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 on diltiazem  . Hemorrhoids 01/20/2016  . LV dysfunction    a. echo 12/2015: EF 45-50%, mild global HK, normal diastolic function, mildly dilated LA, trace MR, trace TR, unable to estimate PASP  . Near syncope 01/01/2016  . PAF (paroxysmal atrial fibrillation) (HCC)    a. diagnosed in 12/2015; b. failed flecainide and multaq; c. CHADS2VASc => 1 (CHF); d. on xarelto; e. s/p successful Afib ablation 09/29/2016  . Paroxysmal atrial flutter (HCC) 01/02/2016    Past Surgical History:  Procedure Laterality Date  . APPENDECTOMY  1973  . ATRIAL FIBRILLATION ABLATION N/A 09/29/2016   Procedure: Atrial Fibrillation Ablation;  Surgeon: Regan Lemming, MD;  Location: [facility] INVASIVE CV LAB;  Service: Cardiovascular;  Laterality: N/A;  . CATARACT EXTRACTION Right  09/2017   has had infection, then oil bubble, the gas bubble 02/06/2018  . KNEE SURGERY Right 1999    Social History   Socioeconomic History  . Marital status: Married    Spouse name: Not on file  . Number of children: 1  . Years of education: Not on file  . Highest education level: Not on file  Occupational History  . Not on file  Social Needs  . Financial resource strain: Not on file  . Food insecurity:    Worry: Not on file    Inability: Not on file  . Transportation needs:    Medical: Not on file    Non-medical: Not on file  Tobacco Use  . Smoking status: Never Smoker  . Smokeless tobacco: Never Used  Substance and Sexual Activity  . Alcohol use: Yes    Comment: occasional  . Drug use: Not on file  . Sexual activity: Not on file  Lifestyle  . Physical activity:    Days per week: Not on file    Minutes per session: Not on file  . Stress: Not on file  Relationships  . Social connections:    Talks on phone: Not on file    Gets together: Not on file    Attends religious service: Not on file    Active member of club or organization: Not on file    Attends meetings of clubs or organizations: Not on file    Relationship status: Not on file  . Intimate partner violence:    Fear of current or ex partner:  Not on file    Emotionally abused: Not on file    Physically abused: Not on file    Forced sexual activity: Not on file  Other Topics Concern  . Not on file  Social History Narrative  . Not on file    Current Outpatient Medications on File Prior to Visit  Medication Sig Dispense Refill  . Cholecalciferol (D3 SUPER STRENGTH) 2000 units CAPS Take 2,000 Units by mouth daily.    Marland Kitchen diltiazem (DILACOR XR) 180 MG 24 hr capsule Take 180 mg by mouth daily.    . finasteride (PROSCAR) 5 MG tablet Take 5 mg by mouth daily.    . rosuvastatin (CRESTOR) 10 MG tablet TAKE 1 TABLET BY MOUTH EVERY DAY 90 tablet 1  . vitamin E 400 UNIT capsule Take 400 Units by mouth daily.      No current facility-administered medications on file prior to visit.      ROS     Objective:  There were no vitals taken for this visit. There is no height or weight on file to calculate BMI.   Physical Exam   CARDIAC STUDIES:  Echocardiogram 04/18/2017: Left ventricle cavity is normal in size. Mild concentric hypertrophy of the left ventricle. Mild decrease in global wall motion. Visual EF is 45-50%. Doppler evidence of grade I (impaired) diastolic dysfunction, normal LAP. Left atrial cavity is moderately dilated. IVC is dilated with blunted respiratory response. This may suggest elevated right atrial pressure. Compared to prior study in 07/2016, marginal improvement in LVEF. Frequent PVC's not seen on this study. Impression: Exercise sestamibi stress test 07/14/2016: 1. Resting EKG demonstrates atypical atrial flutter with variable ventricular response. Stress EKG was negative for myocardial ischemia. Patient appeared to have developed atrial fibrillation with peak exercise which reverted to atypical atrial flutter versus coarse atrial fibrillation in recovery. Rare PVC. Nonspecific ST segment changes noted at both rest and stress. Patient exercised for 7:30 minutes and achieved 10.35 METs. Normal blood pressure response. 2. Left ventricular cavity is noted to be enlarged on the rest and stress studies and measured 142 mL. SPECT images demonstrate homogeneous tracer distribution throughout the myocardium. Gated SPECT imaging demonstrates global hypokinesis. The left ventricular ejection fraction was calculated or visually estimated to be 32%. 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 This is an intermediate risk study, clinical correlation recommended.  Assessment & Recommendations:   There are no diagnoses linked to this encounter.  Recommendation: Patient is here on 6 month follow up visit for history of atrial fibrillation post cardiac ablation. Fortunatley, he has  maintained sinus rhythm. Now off Xarelto. No specific complaints today. I have discussed his recent echocardiogram results and he has had improvement in LVEF to 40-45%. Tolerating all medications well and is on appropriate medical therapy. He is now on CPAP for obstructive sleep apnea being managed by Dr. Vickey Huger.  I have reviewed his recently obtained lipids, which are well controlled. He is stable from a cardiac standpoint. No changes were made to medications today. Patient has made significant lifestyle changes and has lost 20-25 lbs. I have congratulated him and provided him with positive reinforcement. We will see him back in 1 year or sooner if problems.  Lennox Solders, MD, [facility] 05/03/2018, 10:01 AM [facility] [place] Pager: [phone] Office: [phone] If no answer Cell [phone]

## 2018-05-16 ENCOUNTER — Ambulatory Visit: Payer: 59

## 2018-05-16 DIAGNOSIS — I420 Dilated cardiomyopathy: Secondary | ICD-10-CM

## 2018-05-22 ENCOUNTER — Telehealth: Payer: Self-pay | Admitting: Cardiology

## 2018-05-22 NOTE — Telephone Encounter (Signed)
Patient seen by you

## 2018-05-22 NOTE — Telephone Encounter (Signed)
No significant change in his heart function and mild to at most moderate decrease at 40-45% and stable from before

## 2018-05-22 NOTE — Telephone Encounter (Signed)
No significant change in his heart function and mild to at most moderate decrease at 40-45% and stable from before 

## 2018-07-02 ENCOUNTER — Other Ambulatory Visit: Payer: Self-pay | Admitting: Cardiology

## 2018-07-02 DIAGNOSIS — Z8679 Personal history of other diseases of the circulatory system: Secondary | ICD-10-CM

## 2018-07-02 MED ORDER — DILTIAZEM HCL ER 120 MG PO CP24: 120.0000 mg | ORAL_CAPSULE | Freq: Every day | ORAL | 2 refills | Status: DC

## 2018-07-03 ENCOUNTER — Other Ambulatory Visit: Payer: Self-pay

## 2018-07-03 DIAGNOSIS — Z8679 Personal history of other diseases of the circulatory system: Secondary | ICD-10-CM

## 2018-07-03 MED ORDER — DILTIAZEM HCL ER 120 MG PO CP24: 120.0000 mg | ORAL_CAPSULE | Freq: Every day | ORAL | 1 refills | Status: DC

## 2018-10-12 IMAGING — CT CT HEART MORPH/PULM VEIN W/ CM & W/O CA SCORE
1 of 7 series · 12 of 20 positions shown, 15 images · IV contrast (OMNI 350)
Comparison: None.

CLINICAL DATA: Chest pain

EXAM:
Cardiac CTA
MEDICATIONS:
Sub lingual nitro. 4mg and lopressor 5mg IV
TECHNIQUE: The patient was scanned on a [employer] [REDACTED]ice scanner. Gantry
rotation speed was 270 msecs. Collimation was .9mm. A 100 kV
prospective scan was triggered in the descending thoracic aorta at
111 HU's with 5% padding centered around 78% of the R-R interval.
Average HR during the scan was 65 bpm. The 3D data set was
interpreted on a dedicated work station using MPR, MIP and VRT
modes. A total of 80cc of contrast was used.

[Series 16: corcta 0.6 i26f 2 40 - 80 % · axial · 0.44mm/px · z∈[+1212,+1382]mm · 12 of 4536 slices shown, 15 images]
[im 349/4536  vessel]
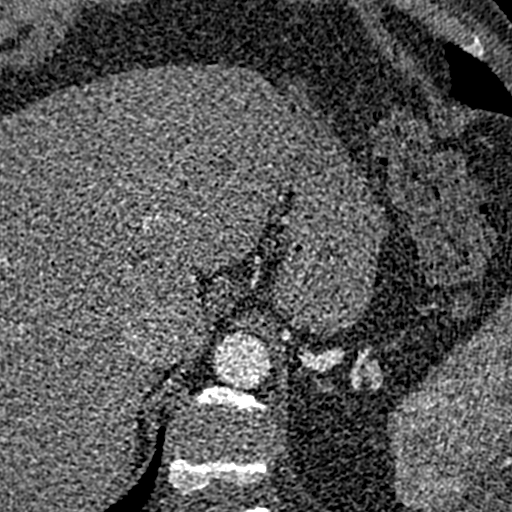
[im 349/4536  lung]
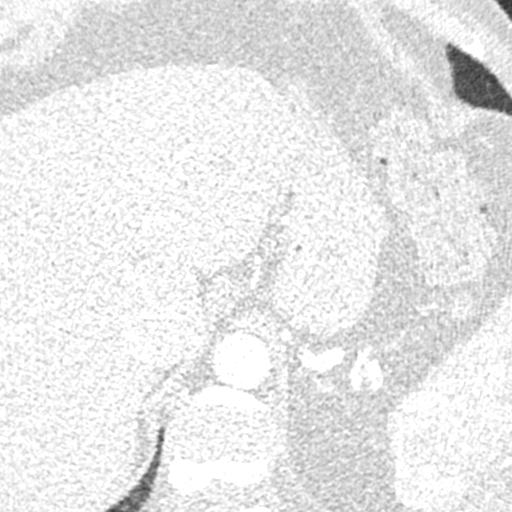
[im 698/4536  vessel]
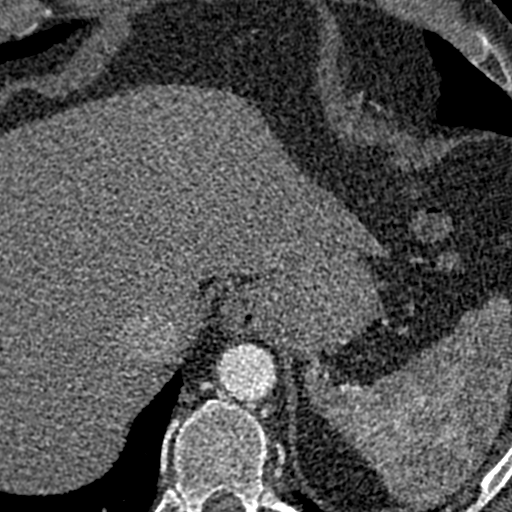
[im 1047/4536  vessel]
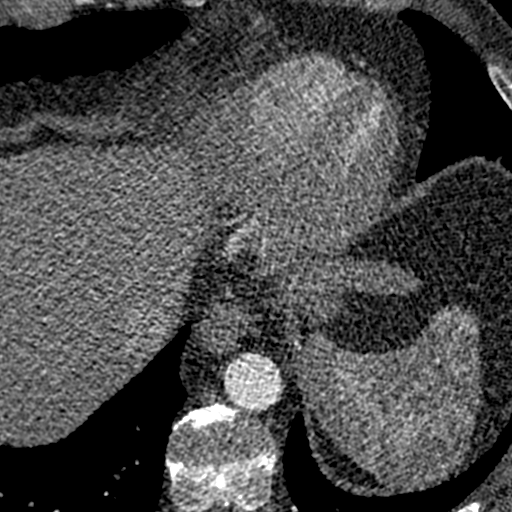
[im 1396/4536  vessel]
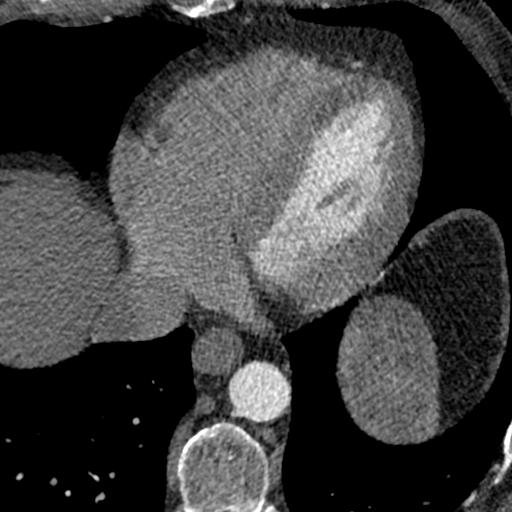
[im 1745/4536  vessel]
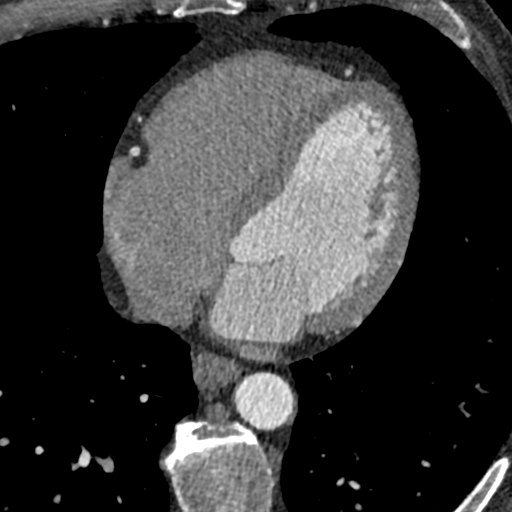
[im 1745/4536  lung]
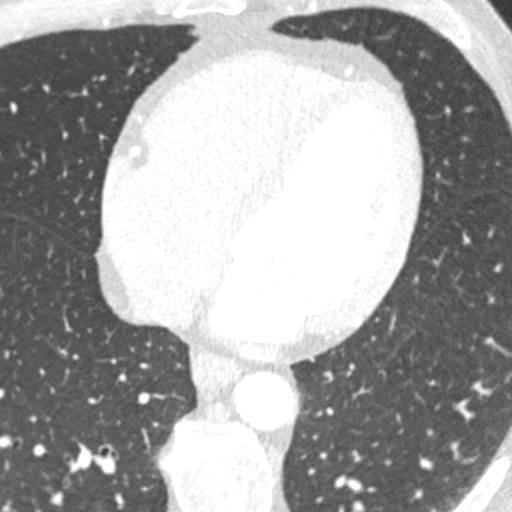
[im 2094/4536  vessel]
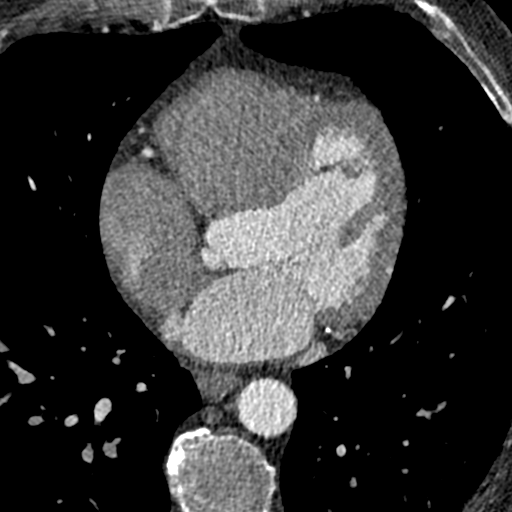
[im 2442/4536  vessel]
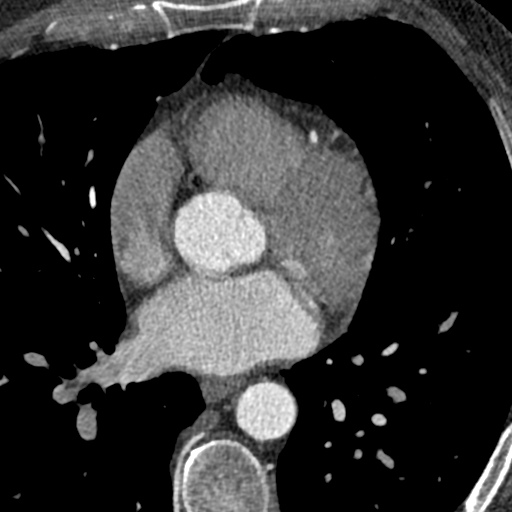
[im 2791/4536  vessel]
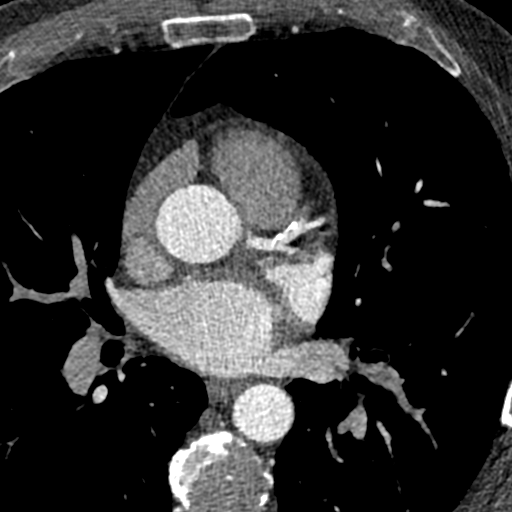
[im 3140/4536  vessel]
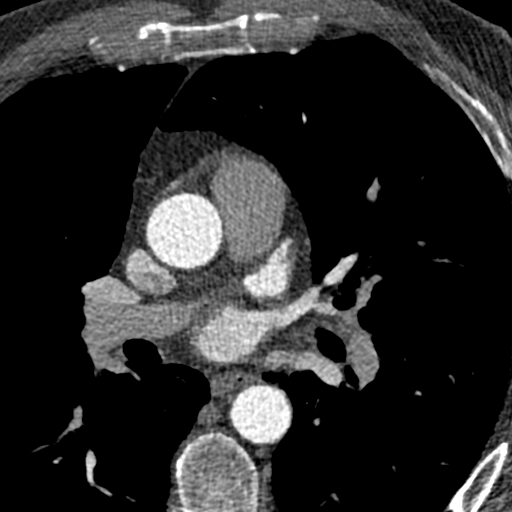
[im 3140/4536  lung]
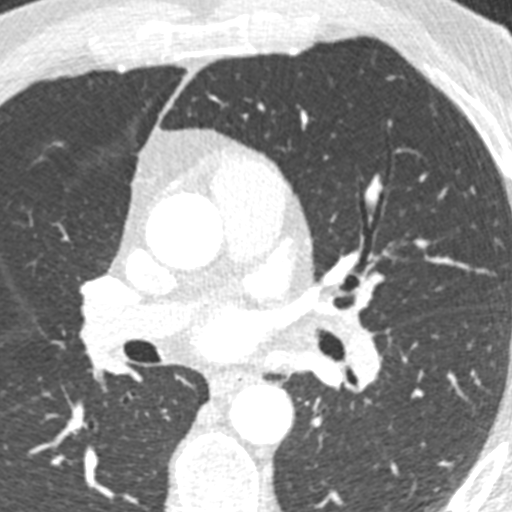
[im 3489/4536  vessel]
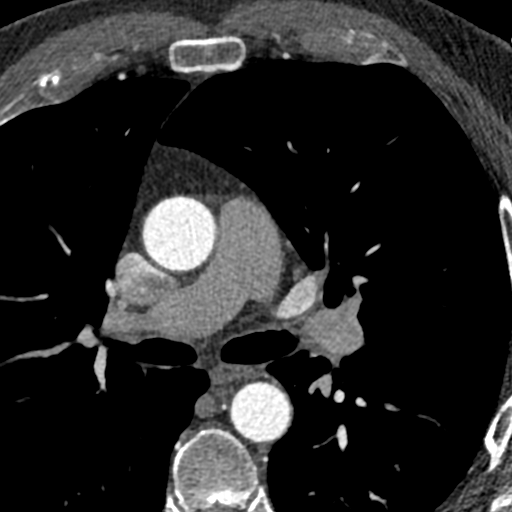
[im 3838/4536  vessel]
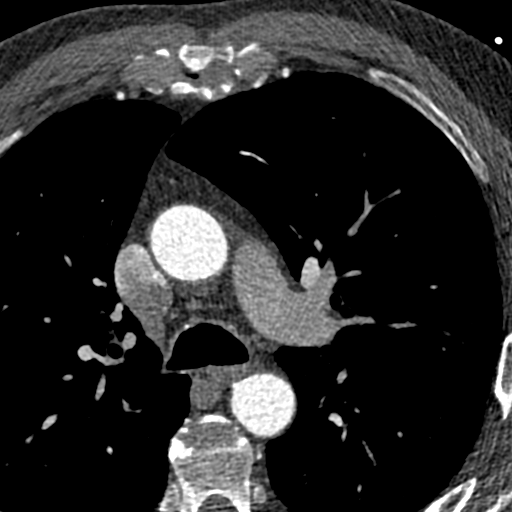
[im 4187/4536  vessel]
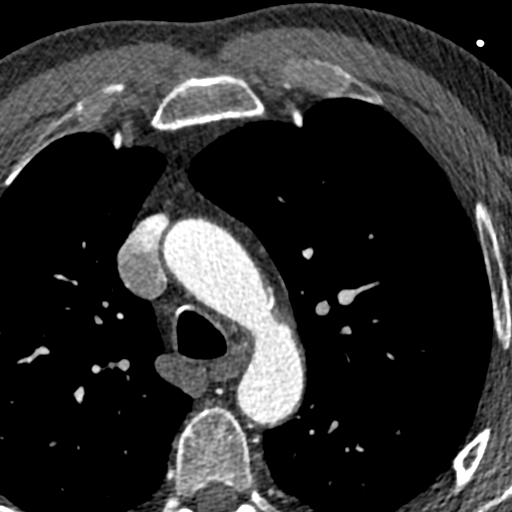

[12 of 20 positions shown; findings below may reference images not displayed]

FINDINGS: Non-cardiac: See separate report from [REDACTED].

Calcium Score:  372 Agatston units.

Coronary Arteries: Right dominant with no anomalies

LM:  No significant coronary disease.

LAD system: There was mixed plaque in the proximal LAD with mild
stenosis. Small D1, moderate D2, small D3 without significant
disease.

Circumflex system: There was a large ramus present with calcified
plaque and mild stenosis present in the proximal vessel. The AV LCx
was a relatively small vessel. There was calcified plaque at the
ostium without significant stenosis. There was calcified plaque in
the mid to distal vessel, probably with mild stenosis (LCx was small
in caliber)

RCA: Mixed plaque in the mid to distal RCA without significant
stenosis.

No left atrial appendage thrombus was noted.

Pulmonary veins: The pulmonary veins drained normally to the left
atrium.

Right upper PV:  21 x 15 mm

Right lower PV:  20 x 18 mm

Left upper PV:  16 x 12 mm

Left lower PV:  18 x 16 mm
IMPRESSION: 1.  Nonobstructive coronary disease as described above.

2.  No LA appendage thrombus noted.

3.  Normal pulmonary vein pattern.

4. Coronary artery calcium score 327 Agatston units, this places the
patient in the 89th percentile for his age and gender. This suggests
high risk for future cardiac events.

Hans Aage Fechete

EXAM:
OVER-READ INTERPRETATION  CT CHEST

The following report is an over-read performed by radiologist Dr.
over-read does not include interpretation of cardiac or coronary
anatomy or pathology. The coronary calcium score/coronary CTA
interpretation by the cardiologist is attached.
FINDINGS: Aortic atherosclerosis. Within the visualized portions of the thorax
there are no suspicious appearing pulmonary nodules or masses, there
is no acute consolidative airspace disease, no pleural effusions, no
pneumothorax and no lymphadenopathy. Small hiatal hernia. Visualized
portions of the upper abdomen demonstrate a 1.8 cm simple cyst in
segment 2 of the liver. There are no aggressive appearing lytic or
blastic lesions noted in the visualized portions of the skeleton.
IMPRESSION: 1. Aortic atherosclerosis.
2. Small hiatal hernia.

Aortic Atherosclerosis (T5LT3-Q6X.X).

## 2018-10-15 ENCOUNTER — Other Ambulatory Visit: Payer: Self-pay | Admitting: Cardiology

## 2018-10-15 DIAGNOSIS — E785 Hyperlipidemia, unspecified: Secondary | ICD-10-CM

## 2018-11-18 ENCOUNTER — Other Ambulatory Visit: Payer: Self-pay | Admitting: Cardiology

## 2018-11-18 DIAGNOSIS — E785 Hyperlipidemia, unspecified: Secondary | ICD-10-CM

## 2018-11-18 MED ORDER — ROSUVASTATIN CALCIUM 10 MG PO TABS
10.0000 mg | ORAL_TABLET | Freq: Every day | ORAL | 0 refills | Status: DC
Start: 2018-11-18 — End: 2019-01-21

## 2019-01-21 ENCOUNTER — Ambulatory Visit (INDEPENDENT_AMBULATORY_CARE_PROVIDER_SITE_OTHER): Payer: 59 | Admitting: Cardiology

## 2019-01-21 ENCOUNTER — Other Ambulatory Visit: Payer: Self-pay

## 2019-01-21 ENCOUNTER — Encounter: Payer: Self-pay | Admitting: Cardiology

## 2019-01-21 VITALS — BP 126/84 | HR 71 | Ht 77.0 in | Wt 220.2 lb

## 2019-01-21 DIAGNOSIS — E785 Hyperlipidemia, unspecified: Secondary | ICD-10-CM | POA: Diagnosis not present

## 2019-01-21 DIAGNOSIS — I4819 Other persistent atrial fibrillation: Secondary | ICD-10-CM | POA: Diagnosis not present

## 2019-01-21 MED ORDER — ROSUVASTATIN CALCIUM 10 MG PO TABS
10.0000 mg | ORAL_TABLET | Freq: Every day | ORAL | 3 refills | Status: DC
Start: 2019-01-21 — End: 2020-04-13

## 2019-01-21 NOTE — Progress Notes (Signed)
Electrophysiology Office Note   Date:  01/21/2019   ID:  Shermon, Bozzi [birth year], MRN [id number]  PCP:  Lujean Amel, MD  Cardiologist:  Clayborne Dana Primary Electrophysiologist:  Kenrick Pore Meredith Leeds, MD    No chief complaint on file.    History of Present Illness: Fred Thompson is a 58 y.o. male who is being seen today for the evaluation of atrial fibrillation/flutter at the request of [name], [name], MD. Presenting today for electrophysiology evaluation.  He was initially diagnosed with atrial fibrillation in October 2017. Initial symptoms were dizziness, diaphoresis, or syncope. He was a fundraiser time taking in the hospital was diagnosed. He was since placed on flecainide, and did not tolerate this medication due to fatigue. I Yousaf Sainato switch to multaq, and he continues to have pain amounts of fatigue. He has had nuclear stress tests, which apparently been normal, and an echo showed an EF of 45-50%. Had AF ablation 09/29/16.  Today, denies symptoms of palpitations, chest pain, shortness of breath, orthopnea, PND, lower extremity edema, claudication, dizziness, presyncope, syncope, bleeding, or neurologic sequela. The patient is tolerating medications without difficulties.  Overall he is feeling well.  No chest pain or shortness of breath.  He has had no further episodes of atrial fibrillation since ablation.   Past Medical History:  Diagnosis Date  . Allergic rhinitis 01/20/2016  . Bell's palsy   . BPH (benign prostatic hyperplasia) 01/01/2016  . Dyslipidemia (high LDL; low HDL) 01/01/2016  . 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 on diltiazem  . Hemorrhoids 01/20/2016  . LV dysfunction    a. echo 12/2015: EF 45-50%, mild global HK, normal diastolic function, mildly dilated LA, trace MR, trace TR, unable to estimate PASP  . Near syncope 01/01/2016  . PAF (paroxysmal atrial fibrillation) ([place])    a. diagnosed in 12/2015; b. failed flecainide and multaq; c. CHADS2VASc => 1 (CHF);  d. on xarelto; e. s/p successful Afib ablation 09/29/2016  . Paroxysmal atrial flutter ([place]) 01/02/2016  . PVC (premature ventricular contraction)    Past Surgical History:  Procedure Laterality Date  . APPENDECTOMY  1973  . ATRIAL FIBRILLATION ABLATION N/A 09/29/2016   Procedure: Atrial Fibrillation Ablation;  Surgeon: Constance Haw, MD;  Location: [facility];  Service: Cardiovascular;  Laterality: N/A;  . CATARACT EXTRACTION Right 09/2017   has had infection, then oil bubble, the gas bubble 02/06/2018  . KNEE SURGERY Right 1999     Current Outpatient Medications  Medication Sig Dispense Refill  . brimonidine (ALPHAGAN) 0.2 % ophthalmic solution Place 1 drop into both eyes daily.    . Cholecalciferol (D3 SUPER STRENGTH) 2000 units CAPS Take 2,000 Units by mouth daily.    Marland Kitchen diltiazem (DILACOR XR) 120 MG 24 hr capsule Take 1 capsule (120 mg total) by mouth daily. 90 capsule 1  . dorzolamide-timolol (COSOPT) 22.3-6.8 MG/ML ophthalmic solution Place 1 drop into the right eye 2 (two) times daily.    . finasteride (PROSCAR) 5 MG tablet Take 5 mg by mouth daily.    . vitamin E 400 UNIT capsule Take 400 Units by mouth daily.    . rosuvastatin (CRESTOR) 10 MG tablet Take 1 tablet (10 mg total) by mouth daily. 90 tablet 3   No current facility-administered medications for this visit.     Allergies:   Atorvastatin   Social History:  The patient  reports that he has never smoked. He has never used smokeless tobacco. He reports current alcohol use.  Family History:  The patient's family history includes CAD in his father; Healthy in his sister.   ROS:  Please see the history of present illness.   Otherwise, review of systems is positive for none.   All other systems are reviewed and negative.   PHYSICAL EXAM: VS:  BP 126/84   Pulse 71   Ht 6\' 5"  (1.956 m)   Wt 220 lb 3.2 oz (99.9 kg)   SpO2 97%   BMI 26.11 kg/m  , BMI Body mass index is 26.11 kg/m. GEN: Well nourished,  well developed, in no acute distress  HEENT: normal  Neck: no JVD, carotid bruits, or masses Cardiac: RRR; no murmurs, rubs, or gallops,no edema  Respiratory:  clear to auscultation bilaterally, normal work of breathing GI: soft, nontender, nondistended, + BS MS: no deformity or atrophy  Skin: warm and dry Neuro:  Strength and sensation are intact Psych: euthymic mood, full affect  EKG:  EKG is ordered today. Personal review of the ekg ordered shows this rhythm, rate 71  Recent Labs: No results found for requested labs within last 8760 hours.    Lipid Panel     Component Value Date/Time   CHOL 144 04/06/2017 0738   TRIG 74 04/06/2017 0738   HDL 47 04/06/2017 0738   CHOLHDL 3.1 04/06/2017 0738   LDLCALC 82 04/06/2017 0738     Wt Readings from Last 3 Encounters:  01/21/19 220 lb 3.2 oz (99.9 kg)  05/03/18 224 lb (101.6 kg)  05/04/17 231 lb (104.8 kg)      Other studies Reviewed: Additional studies/ records that were reviewed today include: ETT 05/15/16  Review of the above records today demonstrates:    Blood pressure demonstrated a hypertensive response to exercise. 1 mm upsloping ST depression in inferior leads with exercise Non diagnostic due to achieving onlyh 82% of PMHR PVC;s and bigemminy with stress HTN response to exercise   TTE 05/21/18 Left ventricle cavity is normal in size. Moderate concentric hypertrophy of the left ventricle. Mild decrease in global wall motion. Visual EF is 45-50%. Doppler evidence of grade I (impaired) diastolic dysfunction, normal LAP. Calculated EF 40%. No significant valvular abnormality.  IVC is dilated with respiratory variation. This may suggest elevated right heart pressure. No significant change compared to previous study on 04/18/2017.  Holter 01/18/17 - personally reviewed Minimum HR: 71 BPM at 9:54:24 PM Maximum HR: 134 BPM at 12:56:24 PM(2) Average HR: 86 BPM <1% PVCs, <1% APCs 6 beat run of likely atrial  tachycardia Sinus rhythm without other arrhythmia  ASSESSMENT AND PLAN:  1.  Atrial fibrillation/flutter: Currently not anticoagulated.  Status post ablation 09/29/2016.  No further episodes  This patients CHA2DS2-VASc Score and unadjusted Ischemic Stroke Rate (% per year) is equal to 0.6 % stroke rate/year from a score of 1  Above score calculated as 1 point each if present [CHF, HTN, DM, Vascular=MI/PAD/Aortic Plaque, Age if 65-74, or Male] Above score calculated as 2 points each if present [Age > 75, or Stroke/TIA/TE]   2. PVCs: Low burden on cardiac monitor.  No changes.  3. Mild LV dysfunction: Plan per Dr. 10/01/2016. Current medicines are reviewed at length with the patient today.   The patient does not have concerns regarding his medicines.  The following changes were made today: None  Labs/ tests ordered today include:  Orders Placed This Encounter  Procedures  . EKG 12-Lead     Disposition:   FU with Nathanuel Cabreja 12 month  Signed, Shali Vesey  Jorja Loa, MD  01/21/2019 4:15 PM     [facility] [address] [place] [address] [phone] (office) [phone] (fax)

## 2019-03-10 ENCOUNTER — Other Ambulatory Visit: Payer: Self-pay

## 2019-03-10 ENCOUNTER — Encounter: Payer: Self-pay | Admitting: Adult Health

## 2019-03-10 ENCOUNTER — Ambulatory Visit (INDEPENDENT_AMBULATORY_CARE_PROVIDER_SITE_OTHER): Payer: Managed Care, Other (non HMO) | Admitting: Adult Health

## 2019-03-10 VITALS — BP 128/78 | HR 68 | Temp 97.6°F | Ht 77.0 in | Wt 223.2 lb

## 2019-03-10 DIAGNOSIS — Z9989 Dependence on other enabling machines and devices: Secondary | ICD-10-CM | POA: Diagnosis not present

## 2019-03-10 DIAGNOSIS — G4733 Obstructive sleep apnea (adult) (pediatric): Secondary | ICD-10-CM | POA: Diagnosis not present

## 2019-03-10 NOTE — Patient Instructions (Addendum)
Continue using CPAP nightly and greater than 4 hours each night °If your symptoms worsen or you develop new symptoms please let us know.  ° °

## 2019-03-10 NOTE — Progress Notes (Signed)
PATIENT: Fred Thompson DOB: [birth year]  REASON FOR VISIT: follow up HISTORY FROM: patient  HISTORY OF PRESENT ILLNESS: Today 03/10/19:  Fred Thompson is a 58 year old male with a history of obstructive sleep apnea on CPAP.  He returns today for follow-up.  His download indicates that he use his machine 30 out of 30 days for compliance of 100%.  He uses machine greater than 4 hours 27 days for compliance of 90%.  On average he uses his machine 7 hours and 7 minutes.  His residual AHI is 1.7 on 12 cm of water with EPR of 3.  His leak in the 95th percentile is 2.2 L/min.  He states that he does not like his head strap however he defers on a mask refitting to try different style of mask.  He returns today for an evaluation  HISTORY 03/07/18:  Fred Thompson is a 58 year old male with a history of obstructive sleep apnea on CPAP.  His download indicates that he uses machine 27 out of 30 days for compliance of 90%.  He uses machine greater than 4 hours 24 days for compliance of 80%.  On average he uses his machine 6 hours and 27 minutes.  His residual AHI is 1.2 on 12 cm of water with EPR 3.  He does not have a significant leak.  He returns today for evaluation.  REVIEW OF SYSTEMS: Out of a complete 14 system review of symptoms, the patient complains only of the following symptoms, and all other reviewed systems are negative.  ALLERGIES: Allergies  Allergen Reactions  . Atorvastatin Other (See Comments)    Fatigue, myalgias Other reaction(s): Other (See Comments) Fatigue, myalgias    HOME MEDICATIONS: Outpatient Medications Prior to Visit  Medication Sig Dispense Refill  . brimonidine (ALPHAGAN) 0.2 % ophthalmic solution Place 1 drop into both eyes daily.    . Cholecalciferol (D3 SUPER STRENGTH) 2000 units CAPS Take 2,000 Units by mouth daily.    Marland Kitchen diltiazem (DILACOR XR) 120 MG 24 hr capsule Take 1 capsule (120 mg total) by mouth daily. 90 capsule 1  . dorzolamide-timolol (COSOPT)  22.3-6.8 MG/ML ophthalmic solution Place 1 drop into the right eye 2 (two) times daily.    . finasteride (PROSCAR) 5 MG tablet Take 5 mg by mouth daily.    . rosuvastatin (CRESTOR) 10 MG tablet Take 1 tablet (10 mg total) by mouth daily. 90 tablet 3  . vitamin E 400 UNIT capsule Take 400 Units by mouth daily.     No facility-administered medications prior to visit.    PAST MEDICAL HISTORY: Past Medical History:  Diagnosis Date  . Allergic rhinitis 01/20/2016  . Bell's palsy   . BPH (benign prostatic hyperplasia) 01/01/2016  . Dyslipidemia (high LDL; low HDL) 01/01/2016  . 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 on diltiazem  . Hemorrhoids 01/20/2016  . LV dysfunction    a. echo 12/2015: EF 45-50%, mild global HK, normal diastolic function, mildly dilated LA, trace MR, trace TR, unable to estimate PASP  . Near syncope 01/01/2016  . PAF (paroxysmal atrial fibrillation) (HCC)    a. diagnosed in 12/2015; b. failed flecainide and multaq; c. CHADS2VASc => 1 (CHF); d. on xarelto; e. s/p successful Afib ablation 09/29/2016  . Paroxysmal atrial flutter (HCC) 01/02/2016  . PVC (premature ventricular contraction)     PAST SURGICAL HISTORY: Past Surgical History:  Procedure Laterality Date  . APPENDECTOMY  1973  . ATRIAL FIBRILLATION ABLATION N/A 09/29/2016   Procedure: Atrial Fibrillation  Ablation;  Surgeon: Regan Lemming, MD;  Location: [facility] INVASIVE CV LAB;  Service: Cardiovascular;  Laterality: N/A;  . CATARACT EXTRACTION Right 09/2017   has had infection, then oil bubble, the gas bubble 02/06/2018  . KNEE SURGERY Right 1999    FAMILY HISTORY: Family History  Problem Relation Age of Onset  . CAD Father        quad bypass @ 51  . Healthy Sister     SOCIAL HISTORY: Social History   Socioeconomic History  . Marital status: Married    Spouse name: Not on file  . Number of children: 1  . Years of education: Not on file  . Highest education level: Not on file  Occupational History  .  Not on file  Tobacco Use  . Smoking status: Never Smoker  . Smokeless tobacco: Never Used  Substance and Sexual Activity  . Alcohol use: Yes    Comment: occasional  . Drug use: Not on file  . Sexual activity: Not on file  Other Topics Concern  . Not on file  Social History Narrative  . Not on file   Social Determinants of Health   Financial Resource Strain:   . Difficulty of Paying Living Expenses: Not on file  Food Insecurity:   . Worried About Programme researcher, broadcasting/film/video in the Last Year: Not on file  . Ran Out of Food in the Last Year: Not on file  Transportation Needs:   . Lack of Transportation (Medical): Not on file  . Lack of Transportation (Non-Medical): Not on file  Physical Activity:   . Days of Exercise per Week: Not on file  . Minutes of Exercise per Session: Not on file  Stress:   . Feeling of Stress : Not on file  Social Connections:   . Frequency of Communication with Friends and Family: Not on file  . Frequency of Social Gatherings with Friends and Family: Not on file  . Attends Religious Services: Not on file  . Active Member of Clubs or Organizations: Not on file  . Attends Banker Meetings: Not on file  . Marital Status: Not on file  Intimate Partner Violence:   . Fear of Current or Ex-Partner: Not on file  . Emotionally Abused: Not on file  . Physically Abused: Not on file  . Sexually Abused: Not on file      PHYSICAL EXAM  Vitals:   03/10/19 1320  BP: 128/78  Pulse: 68  Temp: 97.6 F (36.4 C)  TempSrc: Oral  Weight: 223 lb 3.2 oz (101.2 kg)  Height: 6\' 5"  (1.956 m)   Body mass index is 26.47 kg/m.  Generalized: Well developed, in no acute distress  Chest: Lungs clear to auscultation bilaterally  Neurological examination  Mentation: Alert oriented to time, place, history taking. Follows all commands speech and language fluent Cranial nerve II-XII: Extraocular movements were full, visual field were full on confrontational test  Head turning and shoulder shrug  were normal and symmetric. Motor: The motor testing reveals 5 over 5 strength of all 4 extremities. Good symmetric motor tone is noted throughout.  Sensory: Sensory testing is intact to soft touch on all 4 extremities. No evidence of extinction is noted.  Gait and station: Gait is normal.    DIAGNOSTIC DATA (LABS, IMAGING, TESTING) - I reviewed patient records, labs, notes, testing and imaging myself where available.  Lab Results  Component Value Date   WBC 5.6 09/30/2016   HGB 11.2 (L) 09/30/2016  HCT 34.7 (L) 09/30/2016   MCV 86.1 09/30/2016   PLT 181 09/30/2016      Component Value Date/Time   NA 137 09/29/2016 1028   K 3.8 09/29/2016 1028   CL 107 09/29/2016 1028   CO2 24 09/29/2016 1028   GLUCOSE 98 09/29/2016 1028   BUN 10 09/29/2016 1028   CREATININE 1.00 09/29/2016 1028   CALCIUM 8.7 (L) 09/29/2016 1028   PROT 7.2 04/06/2017 0738   ALBUMIN 4.4 04/06/2017 0738   AST 22 04/06/2017 0738   ALT 26 04/06/2017 0738   ALKPHOS 77 04/06/2017 0738   BILITOT 0.3 04/06/2017 0738   GFRNONAA >60 09/29/2016 1028   GFRAA >60 09/29/2016 1028   Lab Results  Component Value Date   CHOL 144 04/06/2017   HDL 47 04/06/2017   LDLCALC 82 04/06/2017   TRIG 74 04/06/2017   CHOLHDL 3.1 04/06/2017      ASSESSMENT AND PLAN 58 y.o. year old male  has a past medical history of Allergic rhinitis (01/20/2016), Bell's palsy, BPH (benign prostatic hyperplasia) (01/01/2016), Dyslipidemia (high LDL; low HDL) (01/01/2016), Frequent PVCs (01/02/2016), Hemorrhoids (01/20/2016), LV dysfunction, Near syncope (01/01/2016), PAF (paroxysmal atrial fibrillation) ([place]), Paroxysmal atrial flutter ([place]) (01/02/2016), and PVC (premature ventricular contraction). here with :  1. Obstructive sleep apnea on CPAP  The patient's CPAP download shows excellent compliance and good treatment of his apnea.  He is encouraged to continue using CPAP nightly and greater than 4 hours each  night.  He is advised that if his symptoms worsen or he develops new symptoms he should let us know.  He will follow-up in 1 year or sooner if needed    I spent 15 minutes with the patient. 50% of this time was spent reviewing CPAP download   Ward Givens, MSN, NP-C 03/10/2019, 1:17 PM [facility] [address], [place], [place] [address] [phone]

## 2019-03-28 ENCOUNTER — Other Ambulatory Visit: Payer: 59

## 2019-03-28 ENCOUNTER — Ambulatory Visit: Payer: 59 | Attending: Internal Medicine

## 2019-03-28 DIAGNOSIS — Z20822 Contact with and (suspected) exposure to covid-19: Secondary | ICD-10-CM

## 2019-03-29 LAB — NOVEL CORONAVIRUS, NAA: SARS-CoV-2, NAA: NOT DETECTED

## 2019-06-10 ENCOUNTER — Ambulatory Visit: Payer: Self-pay | Admitting: General Surgery

## 2019-06-10 NOTE — H&P (View-Only) (Signed)
History of Present Illness Fred Levee MD; 06/10/2019 10:55 AM) The patient is a 59 year old male who presents with hemorrhoids. 59 year old male who presents to the office with approximately 10 year history of hemorrhoid disease. He reports prolapsing hemorrhoids and bleeding which have encouraged her minimally and worsened over the past few years. He has tried prescription suppositories and creams which have helped some. His last colonoscopy was approximately 2 years ago and he is due again in 3 years. He states that he really has difficulty with bleeding. He reports prolapse with standing. He reports bowel movements using fiber supplements.   Past Surgical History Renee Ramus, CMA; 06/10/2019 10:36 AM) Appendectomy  Knee Surgery  Right. Vasectomy   Diagnostic Studies History Renee Ramus, CMA; 06/10/2019 10:36 AM) Colonoscopy  5-10 years ago  Allergies Renee Ramus, CMA; 06/10/2019 10:38 AM) Atorvastatin Calcium *ANTIHYPERLIPIDEMICS*   Medication History (Armen Ferguson, CMA; 06/10/2019 10:40 AM) Vitamin E (400UNIT Tablet, Oral) Active. Crestor (10MG  Tablet, Oral) Active. Diltiazem HCl (120MG /24HR Capsule ER, Oral) Active. Dorzolamide HCl-Timolol Mal (22.3-6.8MG /ML Solution, Ophthalmic) Active. Finasteride (5MG  Tablet, Oral) Active. Vitamin D (1000UNIT Tablet, Oral) Active. Zinc (50MG  Tablet, Oral) Active. Medications Reconciled  Social History , CMA; 06/10/2019 10:36 AM) Alcohol use  Occasional alcohol use. Illicit drug use  Remotely quit drug use. No caffeine use  Tobacco use  Never smoker.  Family History , CMA; 06/10/2019 10:36 AM) Melanoma  Father.  Other Problems Renee Ramus, CMA; 06/10/2019 10:36 AM) Arthritis  Atrial Fibrillation  Enlarged Prostate  Hemorrhoids  Hypercholesterolemia  Sleep Apnea     Review of Systems (Armen Ferguson CMA; 06/10/2019 10:36 AM) General Not Present-  Appetite Loss, Chills, Fatigue, Fever, Night Sweats, Weight Gain and Weight Loss. Skin Not Present- Change in Wart/Mole, Dryness, Hives, Jaundice, New Lesions, Non-Healing Wounds, Rash and Ulcer. HEENT Present- Wears glasses/contact lenses. Not Present- Earache, Hearing Loss, Hoarseness, Nose Bleed, Oral Ulcers, Ringing in the Ears, Seasonal Allergies, Sinus Pain, Sore Throat, Visual Disturbances and Yellow Eyes. Respiratory Present- Snoring. Not Present- Bloody sputum, Chronic Cough, Difficulty Breathing and Wheezing. Breast Not Present- Breast Mass, Breast Pain, Nipple Discharge and Skin Changes. Cardiovascular Not Present- Chest Pain, Difficulty Breathing Lying Down, Leg Cramps, Palpitations, Rapid Heart Rate, Shortness of Breath and Swelling of Extremities. Gastrointestinal Present- Bloody Stool and Hemorrhoids. Not Present- Abdominal Pain, Bloating, Change in Bowel Habits, Chronic diarrhea, Constipation, Difficulty Swallowing, Excessive gas, Gets full quickly at meals, Indigestion, Nausea, Rectal Pain and Vomiting. Male Genitourinary Not Present- Blood in Urine, Change in Urinary Stream, Frequency, Impotence, Nocturia, Painful Urination, Urgency and Urine Leakage. Musculoskeletal Present- Joint Pain, Joint Stiffness and Muscle Weakness. Not Present- Back Pain, Muscle Pain and Swelling of Extremities. Psychiatric Not Present- Anxiety, Bipolar, Change in Sleep Pattern, Depression, Fearful and Frequent crying. Endocrine Not Present- Cold Intolerance, Excessive Hunger, Hair Changes, Heat Intolerance, Hot flashes and New Diabetes. Hematology Not Present- Blood Thinners, Easy Bruising, Excessive bleeding, Gland problems, HIV and Persistent Infections.  Vitals (Armen Ferguson CMA; 06/10/2019 10:38 AM) 06/10/2019 10:37 AM Weight: 228.25 lb Height: 77in Body Surface Area: 2.37 m Body Mass Index: 27.07 kg/m  Temp.: 98.77F  Pulse: 90 (Regular)  P.OX: 96% (Room air) BP: 126/76 (Sitting, Left  Arm, Standard)       Physical Exam 06/12/2019 MD; 06/10/2019 10:56 AM) General Mental Status-Alert. General Appearance-Cooperative.  Abdomen Palpation/Percussion Palpation and Percussion of the abdomen reveal - Soft and Non Tender.  Rectal Anorectal Exam External - skin tag(small). Internal - normal  sphincter tone.   Results Leighton Ruff MD; 0/32/1224 10:57 AM) Procedures  Name Value Date ANOSCOPY, DIAGNOSTIC (82500) [ Hemorrhoids ] Procedure Other: Procedure: Anoscopy....Marland KitchenMarland KitchenSurgeon: Marcello Moores....Marland KitchenMarland KitchenAfter the risks and benefits were explained, verbal consent was obtained for above procedure. A medical assistant chaperone was present thoroughout the entire procedure. ....Marland KitchenMarland KitchenAnesthesia: none....Marland KitchenMarland KitchenDiagnosis: hemorrhoids....Marland KitchenMarland KitchenFindings: Grade 3 left lateral internal hemorrhoid, grade 2-3 right posterior and right anterior hemorrhoids. Elongated anal canal.  Performed: 06/10/2019 10:56 AM    Assessment & Plan Leighton Ruff MD; 3/70/4888 10:56 AM) PROLAPSED INTERNAL HEMORRHOIDS, GRADE 3 (B16.9) Impression: 59 year old male with grade 3 prolapsing hemorrhoids in all 3 locations. We have discussed the typical treatment options including rubber band ligation, trans-hemorrhoidal dearterialization and hemorrhoidectomy. After discussing all of these options he has decided to proceed with trans-hemorrhoidal dearterialization. We have discussed the typical postoperative recovery time and risks of bleeding and recurrence. All questions were answered.

## 2019-06-10 NOTE — H&P (Signed)
History of Present Illness Romie Levee MD; 06/10/2019 10:55 AM) The patient is a 59 year old male who presents with hemorrhoids. 59 year old male who presents to the office with approximately 10 year history of hemorrhoid disease. He reports prolapsing hemorrhoids and bleeding which have encouraged her minimally and worsened over the past few years. He has tried prescription suppositories and creams which have helped some. His last colonoscopy was approximately 2 years ago and he is due again in 3 years. He states that he really has difficulty with bleeding. He reports prolapse with standing. He reports bowel movements using fiber supplements.   Past Surgical History Renee Ramus, CMA; 06/10/2019 10:36 AM) Appendectomy  Knee Surgery  Right. Vasectomy   Diagnostic Studies History Renee Ramus, CMA; 06/10/2019 10:36 AM) Colonoscopy  5-10 years ago  Allergies Renee Ramus, CMA; 06/10/2019 10:38 AM) Atorvastatin Calcium *ANTIHYPERLIPIDEMICS*   Medication History (Armen Ferguson, CMA; 06/10/2019 10:40 AM) Vitamin E (400UNIT Tablet, Oral) Active. Crestor (10MG  Tablet, Oral) Active. Diltiazem HCl (120MG /24HR Capsule ER, Oral) Active. Dorzolamide HCl-Timolol Mal (22.3-6.8MG /ML Solution, Ophthalmic) Active. Finasteride (5MG  Tablet, Oral) Active. Vitamin D (1000UNIT Tablet, Oral) Active. Zinc (50MG  Tablet, Oral) Active. Medications Reconciled  Social History , CMA; 06/10/2019 10:36 AM) Alcohol use  Occasional alcohol use. Illicit drug use  Remotely quit drug use. No caffeine use  Tobacco use  Never smoker.  Family History , CMA; 06/10/2019 10:36 AM) Melanoma  Father.  Other Problems Renee Ramus, CMA; 06/10/2019 10:36 AM) Arthritis  Atrial Fibrillation  Enlarged Prostate  Hemorrhoids  Hypercholesterolemia  Sleep Apnea     Review of Systems (Armen Ferguson CMA; 06/10/2019 10:36 AM) General Not Present-  Appetite Loss, Chills, Fatigue, Fever, Night Sweats, Weight Gain and Weight Loss. Skin Not Present- Change in Wart/Mole, Dryness, Hives, Jaundice, New Lesions, Non-Healing Wounds, Rash and Ulcer. HEENT Present- Wears glasses/contact lenses. Not Present- Earache, Hearing Loss, Hoarseness, Nose Bleed, Oral Ulcers, Ringing in the Ears, Seasonal Allergies, Sinus Pain, Sore Throat, Visual Disturbances and Yellow Eyes. Respiratory Present- Snoring. Not Present- Bloody sputum, Chronic Cough, Difficulty Breathing and Wheezing. Breast Not Present- Breast Mass, Breast Pain, Nipple Discharge and Skin Changes. Cardiovascular Not Present- Chest Pain, Difficulty Breathing Lying Down, Leg Cramps, Palpitations, Rapid Heart Rate, Shortness of Breath and Swelling of Extremities. Gastrointestinal Present- Bloody Stool and Hemorrhoids. Not Present- Abdominal Pain, Bloating, Change in Bowel Habits, Chronic diarrhea, Constipation, Difficulty Swallowing, Excessive gas, Gets full quickly at meals, Indigestion, Nausea, Rectal Pain and Vomiting. Male Genitourinary Not Present- Blood in Urine, Change in Urinary Stream, Frequency, Impotence, Nocturia, Painful Urination, Urgency and Urine Leakage. Musculoskeletal Present- Joint Pain, Joint Stiffness and Muscle Weakness. Not Present- Back Pain, Muscle Pain and Swelling of Extremities. Psychiatric Not Present- Anxiety, Bipolar, Change in Sleep Pattern, Depression, Fearful and Frequent crying. Endocrine Not Present- Cold Intolerance, Excessive Hunger, Hair Changes, Heat Intolerance, Hot flashes and New Diabetes. Hematology Not Present- Blood Thinners, Easy Bruising, Excessive bleeding, Gland problems, HIV and Persistent Infections.  Vitals (Armen Ferguson CMA; 06/10/2019 10:38 AM) 06/10/2019 10:37 AM Weight: 228.25 lb Height: 77in Body Surface Area: 2.37 m Body Mass Index: 27.07 kg/m  Temp.: 98.77F  Pulse: 90 (Regular)  P.OX: 96% (Room air) BP: 126/76 (Sitting, Left  Arm, Standard)       Physical Exam 06/12/2019 MD; 06/10/2019 10:56 AM) General Mental Status-Alert. General Appearance-Cooperative.  Abdomen Palpation/Percussion Palpation and Percussion of the abdomen reveal - Soft and Non Tender.  Rectal Anorectal Exam External - skin tag(small). Internal - normal  sphincter tone.   Results (Clearnce Leja MD; 06/10/2019 10:57 AM) Procedures  Name Value Date ANOSCOPY, DIAGNOSTIC (46600) [ Hemorrhoids ] Procedure Other: Procedure: Anoscopy......Surgeon: Lourdes Manning......After the risks and benefits were explained, verbal consent was obtained for above procedure. A medical assistant chaperone was present thoroughout the entire procedure. ......Anesthesia: none......Diagnosis: hemorrhoids......Findings: Grade 3 left lateral internal hemorrhoid, grade 2-3 right posterior and right anterior hemorrhoids. Elongated anal canal.  Performed: 06/10/2019 10:56 AM    Assessment & Plan (Lucio Litsey MD; 06/10/2019 10:56 AM) PROLAPSED INTERNAL HEMORRHOIDS, GRADE 3 (K64.2) Impression: 58-year-old male with grade 3 prolapsing hemorrhoids in all 3 locations. We have discussed the typical treatment options including rubber band ligation, trans-hemorrhoidal dearterialization and hemorrhoidectomy. After discussing all of these options he has decided to proceed with trans-hemorrhoidal dearterialization. We have discussed the typical postoperative recovery time and risks of bleeding and recurrence. All questions were answered. 

## 2019-07-01 ENCOUNTER — Other Ambulatory Visit: Payer: Self-pay

## 2019-07-01 ENCOUNTER — Encounter (HOSPITAL_BASED_OUTPATIENT_CLINIC_OR_DEPARTMENT_OTHER): Payer: Self-pay | Admitting: General Surgery

## 2019-07-01 NOTE — Progress Notes (Addendum)
AddenduM; spoke with jessica zanettto pa ok to proceed  Spoke w/ via phone for pre-op interview---patient Lab needs dos----  I stat 8          COVID test ------07-05-2019 1200 pm Arrive at -------1215 pm 07-08-2019 No food after midnight, clear liquids until 815 an then npo Medications to take morning of surgery -----eye drops, diltiazem, crestor, finasteride Diabetic medication -----n/a Patient Special Instructions -----bring cpap mask, tubing and machine and leave in car Pre-Op special Istructions ----- Patient verbalized understanding of instructions that were given at this phone interview. Patient denies shortness of breath, chest pain, fever, cough a this phone interview.  Anesthesia : hx afib, lv dysfunction Chart to jessica zanettto pa for review  PCP:dr dibas koirala Cardiologist :dr Elberta Fortis electrophysiology lov 01-21-2019 epic Chest x-ray :none EKG :01-21-2019 epic Echo : 05-16-2018 epic Cardiac Cath : none Coronary ct 09-23-16 epic Sleep Study/ CPAP :sleep study 11-04-2016 epic, lov neurology Butch Penny 03-10-2019 epic Fasting Blood Sugar :      / Checks Blood Sugar -- times a day:  n/a Blood Thinner/ Instructions /Last Dose:n/a ASA / Instructions/ Last Dose : n/a  Patient denies shortness of breath, chest pain, fever, and cough at this phone interview.

## 2019-07-04 ENCOUNTER — Other Ambulatory Visit (HOSPITAL_COMMUNITY): Payer: 59

## 2019-07-05 ENCOUNTER — Other Ambulatory Visit (HOSPITAL_COMMUNITY)
Admission: RE | Admit: 2019-07-05 | Discharge: 2019-07-05 | Disposition: A | Discharge status: Home / Self Care | Payer: 59 | Source: Ambulatory Visit | Attending: General Surgery | Admitting: General Surgery

## 2019-07-05 DIAGNOSIS — Z20822 Contact with and (suspected) exposure to covid-19: Secondary | ICD-10-CM | POA: Diagnosis not present

## 2019-07-05 DIAGNOSIS — Z01812 Encounter for preprocedural laboratory examination: Secondary | ICD-10-CM | POA: Diagnosis not present

## 2019-07-05 LAB — SARS CORONAVIRUS 2 (TAT 6-24 HRS): SARS Coronavirus 2: NEGATIVE

## 2019-07-07 ENCOUNTER — Other Ambulatory Visit (HOSPITAL_COMMUNITY): Payer: 59

## 2019-07-08 ENCOUNTER — Other Ambulatory Visit: Payer: Self-pay

## 2019-07-08 ENCOUNTER — Ambulatory Visit (HOSPITAL_BASED_OUTPATIENT_CLINIC_OR_DEPARTMENT_OTHER): Payer: 59 | Admitting: Physician Assistant

## 2019-07-08 ENCOUNTER — Encounter (HOSPITAL_BASED_OUTPATIENT_CLINIC_OR_DEPARTMENT_OTHER): Payer: Self-pay | Admitting: General Surgery

## 2019-07-08 ENCOUNTER — Ambulatory Visit (HOSPITAL_BASED_OUTPATIENT_CLINIC_OR_DEPARTMENT_OTHER)
Admission: RE | Admit: 2019-07-08 | Discharge: 2019-07-08 | Disposition: A | Discharge status: Home / Self Care | Payer: 59 | Attending: General Surgery | Admitting: General Surgery

## 2019-07-08 ENCOUNTER — Encounter (HOSPITAL_BASED_OUTPATIENT_CLINIC_OR_DEPARTMENT_OTHER)
Admission: RE | Disposition: A | Discharge status: Home / Self Care | Payer: Self-pay | Source: Home / Self Care | Attending: General Surgery

## 2019-07-08 DIAGNOSIS — N400 Enlarged prostate without lower urinary tract symptoms: Secondary | ICD-10-CM | POA: Diagnosis not present

## 2019-07-08 DIAGNOSIS — G4730 Sleep apnea, unspecified: Secondary | ICD-10-CM | POA: Insufficient documentation

## 2019-07-08 DIAGNOSIS — K642 Third degree hemorrhoids: Secondary | ICD-10-CM | POA: Insufficient documentation

## 2019-07-08 DIAGNOSIS — E7800 Pure hypercholesterolemia, unspecified: Secondary | ICD-10-CM | POA: Insufficient documentation

## 2019-07-08 DIAGNOSIS — I4891 Unspecified atrial fibrillation: Secondary | ICD-10-CM | POA: Insufficient documentation

## 2019-07-08 HISTORY — PX: TRANSANAL HEMORRHOIDAL DEARTERIALIZATION: SHX6136

## 2019-07-08 HISTORY — DX: Sleep apnea, unspecified: G47.30

## 2019-07-08 LAB — POCT I-STAT, CHEM 8
BUN: 13 mg/dL (ref 6–20)
Calcium, Ion: 1.24 mmol/L (ref 1.15–1.40)
Chloride: 105 mmol/L (ref 98–111)
Creatinine, Ser: 0.9 mg/dL (ref 0.61–1.24)
Glucose, Bld: 90 mg/dL (ref 70–99)
HCT: 46 % (ref 39.0–52.0)
Hemoglobin: 15.6 g/dL (ref 13.0–17.0)
Potassium: 3.9 mmol/L (ref 3.5–5.1)
Sodium: 141 mmol/L (ref 135–145)
TCO2: 25 mmol/L (ref 22–32)

## 2019-07-08 SURGERY — TRANSANAL HEMORRHOIDAL DEARTERIALIZATION
Anesthesia: Monitor Anesthesia Care | Site: Rectum

## 2019-07-08 MED ORDER — DIAZEPAM 5 MG PO TABS: 5.0000 mg | ORAL_TABLET | Freq: Three times a day (TID) | ORAL | 0 refills | Status: DC | PRN

## 2019-07-08 MED ORDER — BUPIVACAINE LIPOSOME 1.3 % IJ SUSP
INTRAMUSCULAR | Status: DC | PRN
  Administered 2019-07-08: 20 mL

## 2019-07-08 MED ORDER — GABAPENTIN 300 MG PO CAPS
ORAL_CAPSULE | ORAL | Status: AC
  Filled 2019-07-08: qty 1

## 2019-07-08 MED ORDER — GABAPENTIN 300 MG PO CAPS
300.0000 mg | ORAL_CAPSULE | ORAL | Status: AC
  Administered 2019-07-08: 300 mg via ORAL
  Filled 2019-07-08: qty 1

## 2019-07-08 MED ORDER — CELECOXIB 200 MG PO CAPS
ORAL_CAPSULE | ORAL | Status: AC
  Filled 2019-07-08: qty 1

## 2019-07-08 MED ORDER — LIDOCAINE 2% (20 MG/ML) 5 ML SYRINGE
INTRAMUSCULAR | Status: DC | PRN
  Administered 2019-07-08: 40 mg via INTRAVENOUS

## 2019-07-08 MED ORDER — BUPIVACAINE LIPOSOME 1.3 % IJ SUSP
20.0000 mL | Freq: Once | INTRAMUSCULAR | Status: DC
  Filled 2019-07-08: qty 20

## 2019-07-08 MED ORDER — OXYCODONE HCL 5 MG PO TABS
5.0000 mg | ORAL_TABLET | ORAL | Status: DC | PRN
  Filled 2019-07-08: qty 2

## 2019-07-08 MED ORDER — ONDANSETRON HCL 4 MG/2ML IJ SOLN
4.0000 mg | Freq: Four times a day (QID) | INTRAMUSCULAR | Status: DC | PRN
  Filled 2019-07-08: qty 2

## 2019-07-08 MED ORDER — LIDOCAINE 2% (20 MG/ML) 5 ML SYRINGE
INTRAMUSCULAR | Status: AC
  Filled 2019-07-08: qty 5

## 2019-07-08 MED ORDER — PROPOFOL 500 MG/50ML IV EMUL
INTRAVENOUS | Status: DC | PRN
  Administered 2019-07-08: 200 ug/kg/min via INTRAVENOUS

## 2019-07-08 MED ORDER — PROPOFOL 10 MG/ML IV BOLUS
INTRAVENOUS | Status: AC
  Filled 2019-07-08: qty 40

## 2019-07-08 MED ORDER — CELECOXIB 200 MG PO CAPS
200.0000 mg | ORAL_CAPSULE | ORAL | Status: AC
  Administered 2019-07-08: 13:00:00 200 mg via ORAL
  Filled 2019-07-08: qty 1

## 2019-07-08 MED ORDER — ACETAMINOPHEN 500 MG PO TABS
1000.0000 mg | ORAL_TABLET | ORAL | Status: AC
  Administered 2019-07-08: 1000 mg via ORAL
  Filled 2019-07-08: qty 2

## 2019-07-08 MED ORDER — FENTANYL CITRATE (PF) 100 MCG/2ML IJ SOLN
25.0000 ug | INTRAMUSCULAR | Status: DC | PRN
  Filled 2019-07-08: qty 1

## 2019-07-08 MED ORDER — SODIUM CHLORIDE 0.9% FLUSH
3.0000 mL | Freq: Two times a day (BID) | INTRAVENOUS | Status: DC
  Filled 2019-07-08: qty 3

## 2019-07-08 MED ORDER — ACETAMINOPHEN 650 MG RE SUPP
650.0000 mg | RECTAL | Status: DC | PRN
  Filled 2019-07-08: qty 1

## 2019-07-08 MED ORDER — ACETAMINOPHEN 500 MG PO TABS
ORAL_TABLET | ORAL | Status: AC
  Filled 2019-07-08: qty 2

## 2019-07-08 MED ORDER — SODIUM CHLORIDE 0.9 % IV SOLN
250.0000 mL | INTRAVENOUS | Status: DC | PRN
  Filled 2019-07-08: qty 250

## 2019-07-08 MED ORDER — BUPIVACAINE-EPINEPHRINE 0.5% -1:200000 IJ SOLN
INTRAMUSCULAR | Status: DC | PRN
  Administered 2019-07-08: 30 mL

## 2019-07-08 MED ORDER — PROPOFOL 500 MG/50ML IV EMUL
INTRAVENOUS | Status: AC
  Filled 2019-07-08: qty 50

## 2019-07-08 MED ORDER — ACETAMINOPHEN 325 MG PO TABS
650.0000 mg | ORAL_TABLET | ORAL | Status: DC | PRN
  Filled 2019-07-08: qty 2

## 2019-07-08 MED ORDER — SODIUM CHLORIDE 0.9% FLUSH
3.0000 mL | INTRAVENOUS | Status: DC | PRN
  Filled 2019-07-08: qty 3

## 2019-07-08 MED ORDER — OXYCODONE HCL 5 MG/5ML PO SOLN
5.0000 mg | Freq: Once | ORAL | Status: DC | PRN
  Filled 2019-07-08: qty 5

## 2019-07-08 MED ORDER — OXYCODONE HCL 5 MG PO TABS
5.0000 mg | ORAL_TABLET | Freq: Once | ORAL | Status: DC | PRN
  Filled 2019-07-08: qty 1

## 2019-07-08 MED ORDER — PROPOFOL 10 MG/ML IV BOLUS
INTRAVENOUS | Status: DC | PRN
  Administered 2019-07-08: 50 mg via INTRAVENOUS

## 2019-07-08 MED ORDER — OXYCODONE HCL 5 MG PO TABS: 5.0000 mg | ORAL_TABLET | Freq: Four times a day (QID) | ORAL | 0 refills | Status: DC | PRN

## 2019-07-08 MED ORDER — LACTATED RINGERS IV SOLN
INTRAVENOUS | Status: DC
  Filled 2019-07-08: qty 1000

## 2019-07-08 SURGICAL SUPPLY — 37 items
BLADE HEX COATED 2.75 (ELECTRODE) IMPLANT
BRIEF STRETCH FOR OB PAD LRG (UNDERPADS AND DIAPERS) IMPLANT
COVER BACK TABLE 60X90IN (DRAPES) ×2 IMPLANT
COVER SURGICAL LIGHT HANDLE (MISCELLANEOUS) ×1 IMPLANT
COVER WAND RF STERILE (DRAPES) ×2 IMPLANT
DECANTER SPIKE VIAL GLASS SM (MISCELLANEOUS) ×1 IMPLANT
DRAPE HYSTEROSCOPY (DRAPE) ×2 IMPLANT
DRAPE SHEET LG 3/4 BI-LAMINATE (DRAPES) ×2 IMPLANT
DRSG PAD ABDOMINAL 8X10 ST (GAUZE/BANDAGES/DRESSINGS) ×1 IMPLANT
ELECT REM PT RETURN 9FT ADLT (ELECTROSURGICAL) ×2
ELECTRODE REM PT RTRN 9FT ADLT (ELECTROSURGICAL) ×1 IMPLANT
GAUZE SPONGE 4X4 12PLY STRL (GAUZE/BANDAGES/DRESSINGS) ×1 IMPLANT
GAUZE SPONGE 4X4 12PLY STRL LF (GAUZE/BANDAGES/DRESSINGS) ×1 IMPLANT
GLOVE BIO SURGEON STRL SZ 6.5 (GLOVE) ×2 IMPLANT
GLOVE BIOGEL PI IND STRL 7.0 (GLOVE) ×1 IMPLANT
GLOVE BIOGEL PI INDICATOR 7.0 (GLOVE) ×1
GOWN STRL REUS W/TWL 2XL LVL3 (GOWN DISPOSABLE) ×2 IMPLANT
GOWN STRL REUS W/TWL XL LVL3 (GOWN DISPOSABLE) ×2 IMPLANT
HEMOSTAT SURGICEL 4X8 (HEMOSTASIS) IMPLANT
KIT SIGMOIDOSCOPE (SET/KITS/TRAYS/PACK) IMPLANT
KIT SLIDE ONE PROLAPS HEMORR (KITS) ×1 IMPLANT
KIT TURNOVER CYSTO (KITS) ×2 IMPLANT
LEGGING LITHOTOMY PAIR STRL (DRAPES) ×2 IMPLANT
LUBRICANT JELLY K Y 4OZ (MISCELLANEOUS) ×2 IMPLANT
NEEDLE HYPO 22GX1.5 SAFETY (NEEDLE) ×2 IMPLANT
PACK BASIN DAY SURGERY FS (CUSTOM PROCEDURE TRAY) ×2 IMPLANT
PAD ARMBOARD 7.5X6 YLW CONV (MISCELLANEOUS) IMPLANT
PENCIL BUTTON HOLSTER BLD 10FT (ELECTRODE) IMPLANT
SPONGE HEMORRHOID 8X3CM (HEMOSTASIS) IMPLANT
SUT CHROMIC 2 0 SH (SUTURE) IMPLANT
SUT CHROMIC 3 0 SH 27 (SUTURE) IMPLANT
SUT VIC AB 2-0 UR6 27 (SUTURE) IMPLANT
SYR CONTROL 10ML LL (SYRINGE) ×2 IMPLANT
TOWEL OR 17X26 10 PK STRL BLUE (TOWEL DISPOSABLE) ×2 IMPLANT
TRAY DSU PREP LF (CUSTOM PROCEDURE TRAY) ×2 IMPLANT
TUBE CONNECTING 12X1/4 (SUCTIONS) ×2 IMPLANT
YANKAUER SUCT BULB TIP NO VENT (SUCTIONS) ×2 IMPLANT

## 2019-07-08 NOTE — Discharge Instructions (Addendum)
ANORECTAL SURGERY: POST OP INSTRUCTIONS °1. Take your usually prescribed home medications unless otherwise directed. °2. DIET: During the first few hours after surgery sip on some liquids until you are able to urinate.  It is normal to not urinate for several hours after this surgery.  If you feel uncomfortable, please contact the office for instructions.  After you are able to urinate,you may eat, if you feel like it.  Follow a light bland diet the first 24 hours after arrival home, such as soup, liquids, crackers, etc.  Be sure to include lots of fluids daily (6-8 glasses).  Avoid fast food or heavy meals, as your are more likely to get nauseated.  Eat a low fat diet the next few days after surgery.  Limit caffeine intake to 1-2 servings a day. °3. PAIN CONTROL: °a. Pain is best controlled by a usual combination of several different methods TOGETHER: °i. Muscle relaxation °1.  Soak in a warm bath (or Sitz bath) three times a day and after bowel movements.  Continue to do this until all pain is resolved. °2. Take the muscle relaxer (Valium) every 6 hours for the first 2 days after surgery  °ii. Over the counter pain medication °iii. Prescription pain medication °b. Most patients will experience some swelling and discomfort in the anus/rectal area and incisions.  Heat such as warm towels, sitz baths, warm baths, etc to help relax tight/sore spots and speed recovery.  Some people prefer to use ice, especially in the first couple days after surgery, as it may decrease the pain and swelling, or alternate between ice & heat.  Experiment to what works for you.  Swelling and bruising can take several weeks to resolve.  Pain can take even longer to completely resolve. °c. It is helpful to take an over-the-counter pain medication regularly for the first few weeks.  Choose one of the following that works best for you: °i. Naproxen (Aleve, etc)  Two 220mg tabs twice a day °ii. Ibuprofen (Advil, etc) Three 200mg tabs four  times a day (every meal & bedtime) °d. A  prescription for pain medication (such as percocet, oxycodone, hydrocodone, etc) should be given to you upon discharge.  Take your pain medication as prescribed.  °i. If you are having problems/concerns with the prescription medicine (does not control pain, nausea, vomiting, rash, itching, etc), please call us [phone] to see if we need to switch you to a different pain medicine that will work better for you and/or control your side effect better. °ii. If you need a refill on your pain medication, please contact your pharmacy.  They will contact our office to request authorization. Prescriptions will not be filled after 5 pm or on week-ends. °4. KEEP YOUR BOWELS REGULAR and AVOID CONSTIPATION °a. The goal is one to two soft bowel movements a day.  You should at least have a bowel movement every other day. °b. Avoid getting constipated.  Between the surgery and the pain medications, it is common to experience some constipation. This can be very painful after rectal surgery.  Increasing fluid intake and taking a fiber supplement (such as Metamucil, Citrucel, FiberCon, etc) 1-2 times a day regularly will usually help prevent this problem from occurring.  A stool softener like colace is also recommended.  This can be purchased over the counter at your pharmacy.  You can take it up to 3 times a day.  If you do not have a bowel movement after 24 hrs since your surgery,   take one does of milk of magnesia.  If you still haven't had a bowel movement 8-12 hours after that dose, take another dose.  If you don't have a bowel movement 48 hrs after surgery, purchase a Fleets enema from the drug store and administer gently per package instructions.  If you still are having trouble with your bowel movements after that, please call the office for further instructions. °c. If you develop diarrhea or have many loose bowel movements, simplify your diet to bland foods & liquids for a few  days.  Stop any stool softeners and decrease your fiber supplement.  Switching to mild anti-diarrheal medications (Kayopectate, Pepto Bismol) can help.  If this worsens or does not improve, please call us. ° °5. Wound Care °a. Remove your bandages before your first bowel movement or 8 hours after surgery.     °b. Remove any wound packing material at this tim,e as well.  You do not need to repack the wound unless instructed otherwise.  Wear an absorbent pad or soft cotton gauze in your underwear to catch any drainage and help keep the area clean. You should change this every 2-3 hours while awake. °c. Keep the area clean and dry.  Bathe / shower every day, especially after bowel movements.  Keep the area clean by showering / bathing over the incision / wound.   It is okay to soak an open wound to help wash it.  Wet wipes or showers / gentle washing after bowel movements is often less traumatic than regular toilet paper. °d. You may have some styrofoam-like soft packing in the rectum which will come out with the first bowel movement.  °e. You will often notice bleeding with bowel movements.  This should slow down by the end of the first week of surgery °f. Expect some drainage.  This should slow down, too, by the end of the first week of surgery.  Wear an absorbent pad or soft cotton gauze in your underwear until the drainage stops. °g. Do Not sit on a rubber or pillow ring.  This can make you symptoms worse.  You may sit on a soft pillow if needed.  °6. ACTIVITIES as tolerated:   °a. You may resume regular (light) daily activities beginning the next day--such as daily self-care, walking, climbing stairs--gradually increasing activities as tolerated.  If you can walk 30 minutes without difficulty, it is safe to try more intense activity such as jogging, treadmill, bicycling, low-impact aerobics, swimming, etc. °b. Save the most intensive and strenuous activity for last such as sit-ups, heavy lifting, contact sports,  etc  Refrain from any heavy lifting or straining until you are off narcotics for pain control.   °c. You may drive when you are no longer taking prescription pain medication, you can comfortably sit for long periods of time, and you can safely maneuver your car and apply brakes. °d. You may have sexual intercourse when it is comfortable.  °7. FOLLOW UP in our office °a. Please call [facility] at [phone] to set up an appointment to see your surgeon in the office for a follow-up appointment approximately 3-4 weeks after your surgery. °b. Make sure that you call for this appointment the day you arrive home to insure a convenient appointment time. °10. IF YOU HAVE DISABILITY OR FAMILY LEAVE FORMS, BRING THEM TO THE OFFICE FOR PROCESSING.  DO NOT GIVE THEM TO YOUR DOCTOR. ° °WHEN TO CALL US [phone]: °1. Poor pain control °2. Reactions /   problems with new medications (rash/itching, nausea, etc)  °3. Fever over 101.5 F (38.5 C) °4. Inability to urinate °5. Nausea and/or vomiting °6. Worsening swelling or bruising °7. Continued bleeding from incision. °8. Increased pain, redness, or drainage from the incision ° °The clinic staff is available to answer your questions during regular business hours (8:30am-5pm).  Please don’t hesitate to call and ask to speak to one of our nurses for clinical concerns.   A surgeon from [facility] is always on call at the hospitals °  °If you have a medical emergency, go to the nearest emergency room or call 911.  ° °[facility] °[address], [address], [place], [place]  [address] ? °MAIN: [phone] ? TOLL FREE: [phone] ? °FAX [phone] °[email or web address] ° °Information for Discharge Teaching: °EXPAREL (bupivacaine liposome injectable suspension)  ° °Your surgeon or anesthesiologist gave you EXPAREL(bupivacaine) to help control your pain after surgery.  °· EXPAREL is a local anesthetic that provides pain relief by  numbing the tissue around the surgical site. °· EXPAREL is designed to release pain medication over time and can control pain for up to 72 hours. °· Depending on how you respond to EXPAREL, you may require less pain medication during your recovery. ° °Possible side effects: °· Temporary loss of sensation or ability to move in the area where bupivacaine was injected. °· Nausea, vomiting, constipation °· Rarely, numbness and tingling in your mouth or lips, lightheadedness, or anxiety may occur. °· Call your doctor right away if you think you may be experiencing any of these sensations, or if you have other questions regarding possible side effects. ° °Follow all other discharge instructions given to you by your surgeon or nurse. Eat a healthy diet and drink plenty of water or other fluids. ° °If you return to the hospital for any reason within 96 hours following the administration of EXPAREL, it is important for health care providers to know that you have received this anesthetic. A teal colored band has been placed on your arm with the date, time and amount of EXPAREL you have received in order to alert and inform your health care providers. Please leave this armband in place for the full 96 hours following administration, and then you may remove the band. ° °Post Anesthesia Home Care Instructions ° °Activity: °Get plenty of rest for the remainder of the day. A responsible individual must stay with you for 24 hours following the procedure.  °For the next 24 hours, DO NOT: °-Drive a car °-Operate machinery °-Drink alcoholic beverages °-Take any medication unless instructed by your physician °-Make any legal decisions or sign important papers. ° °Meals: °Start with liquid foods such as gelatin or soup. Progress to regular foods as tolerated. Avoid greasy, spicy, heavy foods. If nausea and/or vomiting occur, drink only clear liquids until the nausea and/or vomiting subsides. Call your physician if vomiting  continues. ° °Special Instructions/Symptoms: °Your throat may feel dry or sore from the anesthesia or the breathing tube placed in your throat during surgery. If this causes discomfort, gargle with warm salt water. The discomfort should disappear within 24 hours. ° °

## 2019-07-08 NOTE — Progress Notes (Signed)
Patient and his wife instructed on wound care, including sitz bath.

## 2019-07-08 NOTE — Anesthesia Preprocedure Evaluation (Signed)
Anesthesia Evaluation  Patient identified by MRN, date of birth, ID band Patient awake    Reviewed: Allergy & Precautions, H&P , NPO status , Patient's Chart, lab work & pertinent test results  Airway Mallampati: II   Neck ROM: full    Dental   Pulmonary sleep apnea ,    breath sounds clear to auscultation       Cardiovascular + dysrhythmias Atrial Fibrillation  Rhythm:regular Rate:Normal  S/p ablation for AF   Neuro/Psych  Neuromuscular disease    GI/Hepatic   Endo/Other    Renal/GU      Musculoskeletal   Abdominal   Peds  Hematology   Anesthesia Other Findings   Reproductive/Obstetrics                             Anesthesia Physical Anesthesia Plan  ASA: III  Anesthesia Plan: MAC   Post-op Pain Management:    Induction: Intravenous  PONV Risk Score and Plan: 1 and Ondansetron, Propofol infusion and Treatment may vary due to age or medical condition  Airway Management Planned: Simple Face Mask  Additional Equipment:   Intra-op Plan:   Post-operative Plan:   Informed Consent: I have reviewed the patients History and Physical, chart, labs and discussed the procedure including the risks, benefits and alternatives for the proposed anesthesia with the patient or authorized representative who has indicated his/her understanding and acceptance.       Plan Discussed with: CRNA, Anesthesiologist and Surgeon  Anesthesia Plan Comments:         Anesthesia Quick Evaluation

## 2019-07-08 NOTE — Anesthesia Postprocedure Evaluation (Signed)
Anesthesia Post Note  Patient: Fred Thompson  Procedure(s) Performed: TRANSANAL HEMORRHOIDAL DEARTERIALIZATION (N/A Rectum)     Patient location during evaluation: PACU Anesthesia Type: MAC Level of consciousness: awake and alert Pain management: pain level controlled Vital Signs Assessment: post-procedure vital signs reviewed and stable Respiratory status: spontaneous breathing, nonlabored ventilation, respiratory function stable and patient connected to nasal cannula oxygen Cardiovascular status: stable and blood pressure returned to baseline Postop Assessment: no apparent nausea or vomiting Anesthetic complications: no    Last Vitals:  Vitals:   07/08/19 1545 07/08/19 1600  BP: 125/89 (!) 132/94  Pulse: 66 66  Resp: 16 18  Temp:  (!) 36.4 C  SpO2: 99% 100%    Last Pain:  Vitals:   07/08/19 1635  TempSrc:   PainSc: 0-No pain                 Ellisa Devivo P Pang Robers

## 2019-07-08 NOTE — Op Note (Signed)
07/08/2019  3:18 PM  PATIENT:  Fred Thompson  59 y.o. male  Patient Care Team: Lujean Amel, MD as PCP - General (Family Medicine) Constance Haw, MD as PCP - Electrophysiology (Cardiology)  PRE-OPERATIVE DIAGNOSIS:  GRADE 3 HEMORRHOIDS  POST-OPERATIVE DIAGNOSIS:  GRADE 3 HEMORRHOIDS  PROCEDURE:  Procedure(s): TRANSANAL HEMORRHOIDAL DEARTERIALIZATION  SURGEON:  Surgeon(s): Leighton Ruff, MD  ASSISTANT: none   ANESTHESIA:   local and MAC  EBL: 20 ml Total I/O In: 200 [I.V.:200] Out: 20 [Blood:20]  DRAINS: none   SPECIMEN:  No Specimen  DISPOSITION OF SPECIMEN:  N/A  COUNTS:  YES  PLAN OF CARE: Discharge to home after PACU  PATIENT DISPOSITION:  PACU - hemodynamically stable.  INDICATION: 59 y.o. M with grade 3 internal hemorrhoids   OR FINDINGS: Large Grade 3 internal hemorrhoids with external components  Description: Informed consent was confirmed. Patient underwent general anesthesia without difficulty. Patient was placed into lithotomy positioning.  The perianal region was prepped and draped in sterile fashion. Surgical time out confirmed or plan.  I did digital rectal examination and then transitioned over to anoscopy to get a sense of the anatomy.  I switched over to the [facility] fiberoptically lit Doppler anocope.   Using the Doppler on the tip of the [place] anoscope, I identified the arterial hemorrhoidal vessels coming in in the classic hexagonal anatomical pattern  (right posterior/lateral/anterior, left posterior /lateral/anterior).    I proceeded to ligate the hemorrhoidal arteries. I used a 2-0 Vicryl suture on a UR-6 needle in a figure-of-eight fashion over the signal around 6 cm proximal to the anal verge. I then ran that stitch longitudinally more distally to the dentate line. I then tied that stitch down to cause a hemorrhoidopexy. I did that for all 6 locations.    I redid Doppler anoscopy. I Identified a signal at the RP location.  I isolated and  ligated this with a figure-of-eight stitch. Signals went away.  At completion of this, all hemorrhoids were reduced into the rectum.  There is no more prolapse. External anatomy looked normal.  I repeated anoscopy and examination.   Hemostasis was good.  Patient is being extubated go to recovery room.  I am about to discuss the patient's status to the family.

## 2019-07-08 NOTE — Transfer of Care (Signed)
Immediate Anesthesia Transfer of Care Note  Patient: Fred Thompson  Procedure(s) Performed: Procedure(s) (LRB): TRANSANAL HEMORRHOIDAL DEARTERIALIZATION (N/A)  Patient Location: PACU  Anesthesia Type: MAC  Level of Consciousness: awake, sedated, patient cooperative and responds to stimulation  Airway & Oxygen Therapy: Patient Spontanous Breathing and Patient connected to face mask oxygen and soft FM   Post-op Assessment: Report given to PACU RN, Post -op Vital signs reviewed and stable and Patient moving all extremities  Post vital signs: Reviewed and stable  Complications: No apparent anesthesia complications

## 2019-07-08 NOTE — Interval H&P Note (Signed)
History and Physical Interval Note:  07/08/2019 12:56 PM  Fred Thompson  has presented today for surgery, with the diagnosis of GRADE 3 HEMORRHOIDS.  The various methods of treatment have been discussed with the patient and family. After consideration of risks, benefits and other options for treatment, the patient has consented to  Procedure(s): TRANSANAL HEMORRHOIDAL DEARTERIALIZATION (N/A) as a surgical intervention.  The patient's history has been reviewed, patient examined, no change in status, stable for surgery.  I have reviewed the patient's chart and labs.  Questions were answered to the patient's satisfaction.     Vanita Panda, MD  Colorectal and General Surgery [facility]

## 2019-07-26 ENCOUNTER — Other Ambulatory Visit: Payer: Self-pay | Admitting: Cardiology

## 2019-07-26 DIAGNOSIS — Z8679 Personal history of other diseases of the circulatory system: Secondary | ICD-10-CM

## 2020-02-27 ENCOUNTER — Other Ambulatory Visit: Payer: Self-pay

## 2020-02-27 ENCOUNTER — Encounter: Payer: Self-pay | Admitting: Physical Therapy

## 2020-02-27 ENCOUNTER — Ambulatory Visit: Payer: 59 | Attending: Family Medicine | Admitting: Physical Therapy

## 2020-02-27 DIAGNOSIS — M25511 Pain in right shoulder: Secondary | ICD-10-CM | POA: Insufficient documentation

## 2020-02-27 DIAGNOSIS — G8929 Other chronic pain: Secondary | ICD-10-CM | POA: Diagnosis present

## 2020-02-27 DIAGNOSIS — M6281 Muscle weakness (generalized): Secondary | ICD-10-CM | POA: Insufficient documentation

## 2020-02-27 DIAGNOSIS — M25562 Pain in left knee: Secondary | ICD-10-CM | POA: Diagnosis present

## 2020-02-27 DIAGNOSIS — M25561 Pain in right knee: Secondary | ICD-10-CM | POA: Insufficient documentation

## 2020-02-27 NOTE — Therapy (Addendum)
[facility] [address] [place], [place], [address] Phone: [phone]   Fax:  [phone]  Physical Therapy Evaluation  Patient Details  Name: Fred Thompson MRN: [id number] Date of Birth: [birth year] Referring Provider (PT): [name], MD   Encounter Date: 02/27/2020   PT End of Session - 02/27/20 0917    Visit Number 1    Date for PT Re-Evaluation 04/23/20    PT Start Time 0805    PT Stop Time 0850    PT Time Calculation (min) 45 min    Activity Tolerance Patient tolerated treatment well    Behavior During Therapy [facility] for tasks assessed/performed           Past Medical History:  Diagnosis Date  . Allergic rhinitis 01/20/2016  . Bell's palsy yrs ago  . BPH (benign prostatic hyperplasia) 01/01/2016  . Dyslipidemia (high LDL; low HDL) 01/01/2016  . 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 on diltiazem  . Hemorrhoids 01/20/2016  . LV dysfunction    a. echo 12/2015: EF 45-50%, mild global HK, normal diastolic function, mildly dilated LA, trace MR, trace TR, unable to estimate PASP  . PAF (paroxysmal atrial fibrillation) (HCC)    a. diagnosed in 12/2015; b. failed flecainide and multaq; c. CHADS2VASc => 1 (CHF); d. on xarelto; e. s/p successful Afib ablation 09/29/2016  . Paroxysmal atrial flutter (HCC) 01/02/2016  . PVC (premature ventricular contraction)   . Sleep apnea     Past Surgical History:  Procedure Laterality Date  . APPENDECTOMY  1973  . ATRIAL FIBRILLATION ABLATION N/A 09/29/2016   Procedure: Atrial Fibrillation Ablation;  Surgeon: Regan Lemming, MD;  [facility];  Service: Cardiovascular;  Laterality: N/A;  . CATARACT EXTRACTION Right 09/2017   has had infection, then oil bubble, the gas bubble 02/06/2018  . KNEE SURGERY Right 1999   arthroscopic  . NM RENAL LASIX ([facility] HX) Bilateral 2007  . TRANSANAL HEMORRHOIDAL DEARTERIALIZATION N/A 07/08/2019   Procedure: TRANSANAL  HEMORRHOIDAL DEARTERIALIZATION;  Surgeon: Romie Levee, MD;  Location: [facility];  Service: General;  Laterality: N/A;    There were no vitals filed for this visit.    Subjective Assessment - 02/27/20 0808    Subjective Pt had knee scoped when in his 30s and states he never returned to sports or lifting weight after that due to not wanting to increase arthritis.  Pt currently has pain in both knees epecially at work.  Pt lifts overhead at work and started having shoulder pain.  Pt states this is better since injection   Currently in Pain? Yes    Pain Score 6     Pain Location Knee    Pain Orientation Right;Left   Rt is worse   Pain Descriptors / Indicators Aching;Throbbing    Pain Type Chronic pain    Pain Onset More than a month ago    Pain Frequency Intermittent    Aggravating Factors  up and walking more    Pain Relieving Factors advil    Effect of Pain on Daily Activities walking at work hurts but just get through it    Multiple Pain Sites Yes    Pain Score 4   not currently pain; at worst   Pain Location Shoulder    Pain Orientation Right    Pain Descriptors / Indicators Sharp    Pain Type Acute pain    Pain Radiating Towards it would hurt at night in lateral  upper arm    Pain Onset More than a month ago    Pain Frequency Intermittent    Aggravating Factors  lifting ladder    Pain Relieving Factors certain position when sleeping (ER and overhead)    Effect of Pain on Daily Activities not effecting anything at the moment              [facility] PT Assessment - 03/03/20 0001      Assessment   Medical Diagnosis pain of both knees and Rt shoulder    Referring Provider (PT) [name], [name], MD    Hand Dominance Right    Prior Therapy no      Precautions   Precautions None      Balance Screen   Has the patient fallen in the past 6 months No    How many times? head caught in granddaughters dress when she was on his shoulders and stumbled but not all the  way down    Has the patient had a decrease in activity level because of a fear of falling?  No      Home Nurse, mental health Private residence    Living Arrangements Spouse/significant other;Children;Other relatives   grandchildren     Prior Function   Level of Independence Independent    Vocation Full time employment    Vocation Requirements a lot of stand/walk and traveling    Leisure playing with grandkids      Cognition   Overall Cognitive Status Within Functional Limits for tasks assessed      Observation/Other Assessments   Focus on Therapeutic Outcomes (FOTO)  39%      Posture/Postural Control   Posture/Postural Control Postural limitations    Postural Limitations Rounded Shoulders    Posture Comments trunk rotation to the left with more rounding of right shoulder, elevated shoulders      ROM / Strength   AROM / PROM / Strength AROM;PROM;Strength      AROM   Overall AROM Comments Rt shoulder IR 75% limited; Rt shoulder flex and abd painful arc      Strength   Overall Strength Comments Rt shoulder flex/abd 4/5MMT; LE hip grossly 4/5      Flexibility   Soft Tissue Assessment /Muscle Length yes    Hamstrings 75%      Palpation   Palpation comment TTP Rt humeral head      Special Tests    Special Tests Rotator Cuff Impingement    Rotator Cuff Impingment tests Painful Arc of Motion;Hawkins- Kennedy test      Hawkins-Kennedy test   Findings Positive    Side Right      Painful Arc of Motion   Findings Positive    Side Right      Ambulation/Gait   Gait Pattern Within Functional Limits                      Objective measurements completed on examination: See above findings.       [facility] Adult PT Treatment/Exercise - 03/03/20 0001      Self-Care   Self-Care Other Self-Care Comments    Other Self-Care Comments  edu and performed intial HEP                       PT Long Term Goals - 03/03/20 1849      PT LONG TERM  GOAL #1   Title Pt will be ind with HEP  Time 8    Period Weeks    Status New    Target Date 04/23/20      PT LONG TERM GOAL #2   Title Pt will report at least 50% reduction in knee pain during normal activities    Time 8    Period Weeks    Status New    Target Date 04/23/20      PT LONG TERM GOAL #3   Title Pt will report FOTO </= to 27% limited    Time 8    Period Weeks    Status New    Target Date 04/23/20                  Plan - 03/03/20 2000    Clinical Impression Statement Pt presents to skilled PT for multiple body parts including Rt shoulder and bilateral knees. Pt states he has felt better since receiving and injection in the shoulder. He has painful arc with flexion and abduction from 70-100 deg. Pt has hypomobile GH joint and limited internal ROM. Pt has weak and painful response to MMT of flexion and abduction and to lesser degree with external rotation. Pt demonstrates posture in standing that is excess kyphosis in thoracic spine and forward shoulder Rt>Lt. Pt has rotation in mid thoracic to the Lt. LE assessed and Rt knee seems to be giving him the most pain out of anything especially when at his job where he does a lot of standing on a concrete floor. Pt has weakness hip abduction, flexion and rotation. Pt is mostly interested in learning exercises that he can do at home. Pt will benefit from skilled PT to strength and learn how to manage pain effectively to maintain his active lifestyle.    Personal Factors and Comorbidities Age;Time since onset of injury/illness/exacerbation    Examination-Activity Limitations Stand;Reach Overhead    Examination-Participation Restrictions Occupation    Stability/Clinical Decision Making Stable/Uncomplicated    Rehab Potential Excellent    PT Frequency 1x / week    PT Duration 8 weeks    PT Treatment/Interventions ADLs/Self Care Home Management;Biofeedback;Electrical Stimulation;Patient/family education;Neuromuscular  re-education;Therapeutic activities;Therapeutic exercise;Cryotherapy;Manual techniques;Dry needling;Passive range of motion;Moist Heat;Taping    PT Next Visit Plan hip strength; squats; f/u on initial HEP; progress shoulder strength    PT Home Exercise Plan Access Code: [email or web address]    Consulted and Agree with Plan of Care Patient           Patient will benefit from skilled therapeutic intervention in order to improve the following deficits and impairments:  Decreased strength,Postural dysfunction,Increased muscle spasms,Pain,Impaired UE functional use  Visit Diagnosis: Acute pain of right shoulder  Chronic pain of right knee  Chronic pain of left knee  Muscle weakness (generalized)     Problem List Patient Active Problem List   Diagnosis Date Noted  . OSA on CPAP 03/05/2017  . History of nocturia 03/05/2017  . Leukopenia 09/30/2016  . Anemia 09/30/2016  . PAF (paroxysmal atrial fibrillation) (HCC)   . Allergic rhinitis 01/20/2016  . Hemorrhoids 01/20/2016  . Pure hypercholesterolemia 01/20/2016  . Paroxysmal atrial flutter (HCC) 01/02/2016  . BPH (benign prostatic hyperplasia) 01/01/2016  . Dyslipidemia (high LDL; low HDL) 01/01/2016  . Near syncope 01/01/2016    Junious Silk, PT 03/03/2020, 8:32 PM  [facility] [address] [place], [place], [address] Phone: [phone]   Fax:  [phone]  Name: Fred Thompson MRN: [id number] Date of Birth: 04-Aug-1960

## 2020-02-27 NOTE — Patient Instructions (Signed)
Access Code: [email or web address] URL: [email or web address] Date: 02/27/2020 Prepared by: Dwana Curd  Exercises Sidelying Hip Abduction - 1 x daily - 7 x weekly - 2 sets - 10 reps Hooklying Clamshell with Resistance - 1 x daily - 7 x weekly - 3 sets - 10 reps Seated Bilateral Shoulder External Rotation with Resistance - 1 x daily - 7 x weekly - 3 sets - 10 reps Single Arm Doorway Pec Stretch at 90 Degrees Abduction - 3 x daily - 7 x weekly - 2 sets - 1 reps - 20-30 sec hold Seated Upper Trapezius Stretch - 3 x daily - 7 x weekly - 2 sets - 1 reps - 20-30 hold

## 2020-03-03 NOTE — Addendum Note (Signed)
Addended by: Beatris Si on: 03/03/2020 08:35 PM   Modules accepted: Orders

## 2020-03-05 ENCOUNTER — Other Ambulatory Visit: Payer: Self-pay

## 2020-03-05 ENCOUNTER — Encounter: Payer: Self-pay | Admitting: Physical Therapy

## 2020-03-05 ENCOUNTER — Ambulatory Visit: Payer: 59 | Admitting: Physical Therapy

## 2020-03-05 DIAGNOSIS — M6281 Muscle weakness (generalized): Secondary | ICD-10-CM

## 2020-03-05 DIAGNOSIS — M25511 Pain in right shoulder: Secondary | ICD-10-CM | POA: Diagnosis not present

## 2020-03-05 DIAGNOSIS — G8929 Other chronic pain: Secondary | ICD-10-CM

## 2020-03-05 NOTE — Therapy (Addendum)
[facility] [address] [place], [place], [address] Phone: [phone]   Fax:  [phone]  Physical Therapy Treatment  Patient Details  Name: Fred Thompson MRN: [id number] Date of Birth: 1960/11/13 Referring Provider (PT): Lujean Amel, MD   Encounter Date: 03/05/2020   PT End of Session - 03/05/20 0855    Visit Number 2    Date for PT Re-Evaluation 04/23/20    PT Start Time 0848    PT Stop Time 0928    PT Time Calculation (min) 40 min    Activity Tolerance Patient tolerated treatment well    Behavior During Therapy [facility] for tasks assessed/performed           Past Medical History:  Diagnosis Date  . Allergic rhinitis 01/20/2016  . Bell's palsy yrs ago  . BPH (benign prostatic hyperplasia) 01/01/2016  . Dyslipidemia (high LDL; low HDL) 01/01/2016  . 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 on diltiazem  . Hemorrhoids 01/20/2016  . LV dysfunction    a. echo 12/2015: EF 45-50%, mild global HK, normal diastolic function, mildly dilated LA, trace MR, trace TR, unable to estimate PASP  . PAF (paroxysmal atrial fibrillation) ([place])    a. diagnosed in 12/2015; b. failed flecainide and multaq; c. CHADS2VASc => 1 (CHF); d. on xarelto; e. s/p successful Afib ablation 09/29/2016  . Paroxysmal atrial flutter ([place]) 01/02/2016  . PVC (premature ventricular contraction)   . Sleep apnea     Past Surgical History:  Procedure Laterality Date  . APPENDECTOMY  1973  . ATRIAL FIBRILLATION ABLATION N/A 09/29/2016   Procedure: Atrial Fibrillation Ablation;  Surgeon: Constance Haw, MD;  Location: [place] CV LAB;  Service: Cardiovascular;  Laterality: N/A;  . CATARACT EXTRACTION Right 09/2017   has had infection, then oil bubble, the gas bubble 02/06/2018  . KNEE SURGERY Right 1999   arthroscopic  . NM RENAL LASIX ([place] HX) Bilateral 2007  . TRANSANAL HEMORRHOIDAL DEARTERIALIZATION N/A 07/08/2019   Procedure: TRANSANAL  HEMORRHOIDAL DEARTERIALIZATION;  Surgeon: Leighton Ruff, MD;  Location: [facility];  Service: General;  Laterality: N/A;    There were no vitals filed for this visit.   Subjective Assessment - 03/05/20 0901    Subjective Pt denies pain currently.  Shoulder and knee hurts at night and shoulder woke him Wednesday.    Limitations Lifting    Patient Stated Goals get rid of shoulder pain    Currently in Pain? No/denies                             [facility] Adult PT Treatment/Exercise - 03/05/20 0001      Knee/Hip Exercises: Standing   Functional Squat 10 reps   chair touch   Other Standing Knee Exercises green loop hip abd side and hip ext - 20x      Shoulder Exercises: Supine   Flexion Strengthening;Both;20 reps;Theraband      Shoulder Exercises: Standing   Horizontal ABduction Strengthening;Both;20 reps;Theraband   green   External Rotation Strengthening;Right;20 reps;Theraband    Theraband Level (Shoulder External Rotation) Level 2 (Red)    Internal Rotation Strengthening;Left;Theraband;20 reps    Theraband Level (Shoulder Internal Rotation) Level 3 (Green)    Flexion Strengthening;Both;20 reps;Weights    Shoulder Flexion Weight (lbs) 3    ABduction Strengthening;Both;20 reps    Shoulder ABduction Weight (lbs) 3  PT Education - 03/05/20 0930    Education Details Access Code: [email or web address]    Person(s) Educated Patient    Methods Demonstration;Explanation;Verbal cues;Handout    Comprehension Verbalized understanding;Returned demonstration               PT Long Term Goals - 03/03/20 1849      PT LONG TERM GOAL #1   Title Pt will be ind with HEP    Time 8    Period Weeks    Status New    Target Date 04/23/20      PT LONG TERM GOAL #2   Title Pt will report at least 50% reduction in knee pain during normal activities    Time 8    Period Weeks    Status New    Target Date 04/23/20      PT LONG TERM GOAL #3    Title Pt will report FOTO </= to 27% limited    Time 8    Period Weeks    Status New    Target Date 04/23/20                 Plan - 03/05/20 0930    Clinical Impression Statement Pt did well with exercises and demosntrates fatigue especially with squats.  Pt at first treatment since eval.  Pt will benefit from skilled PT to follow up on response to exercises and help him strengthen and get on an exercise plan.    PT Treatment/Interventions ADLs/Self Care Home Management;Biofeedback;Electrical Stimulation;Patient/family education;Neuromuscular re-education;Therapeutic activities;Therapeutic exercise;Cryotherapy;Manual techniques;Dry needling;Passive range of motion;Moist Heat;Taping    PT Next Visit Plan hip strength; squats; f/u on new HEP; progress shoulder strength    PT Home Exercise Plan Access Code: [email or web address]    Consulted and Agree with Plan of Care Patient           Patient will benefit from skilled therapeutic intervention in order to improve the following deficits and impairments:  Decreased strength,Postural dysfunction,Increased muscle spasms,Pain,Impaired UE functional use  Visit Diagnosis: Acute pain of right shoulder  Chronic pain of right knee  Chronic pain of left knee  Muscle weakness (generalized)     Problem List Patient Active Problem List   Diagnosis Date Noted  . OSA on CPAP 03/05/2017  . History of nocturia 03/05/2017  . Leukopenia 09/30/2016  . Anemia 09/30/2016  . PAF (paroxysmal atrial fibrillation) ([place])   . Allergic rhinitis 01/20/2016  . Hemorrhoids 01/20/2016  . Pure hypercholesterolemia 01/20/2016  . Paroxysmal atrial flutter ([place]) 01/02/2016  . BPH (benign prostatic hyperplasia) 01/01/2016  . Dyslipidemia (high LDL; low HDL) 01/01/2016  . Near syncope 01/01/2016    Jule Ser, PT 03/05/2020, 11:55 AM  [facility] [address], [place], [place],  [address] Phone: [phone]   Fax:  [phone]  Name: Fred Thompson MRN: [id number] Date of Birth: 1960/09/27  PHYSICAL THERAPY DISCHARGE SUMMARY  Visits from Start of Care: 2  Current functional level related to goals / functional outcomes: See above goals   Remaining deficits: See above   Education / Equipment: HEP  Plan: Patient agrees to discharge.  Patient goals were not met. Patient is being discharged due to not returning since the last visit.  ?????     [employer], PT 06/16/20 12:27 PM

## 2020-03-05 NOTE — Patient Instructions (Signed)
Access Code: [email or web address] URL: [email or web address] Date: 03/05/2020 Prepared by: Dwana Curd  Exercises Sidelying Hip Abduction - 1 x daily - 7 x weekly - 2 sets - 10 reps Hooklying Clamshell with Resistance - 1 x daily - 7 x weekly - 3 sets - 10 reps Seated Bilateral Shoulder External Rotation with Resistance - 1 x daily - 7 x weekly - 3 sets - 10 reps Single Arm Doorway Pec Stretch at 90 Degrees Abduction - 3 x daily - 7 x weekly - 2 sets - 1 reps - 20-30 sec hold Seated Upper Trapezius Stretch - 3 x daily - 7 x weekly - 2 sets - 1 reps - 20-30 hold Supine Shoulder Horizontal Abduction with Resistance - 1 x daily - 7 x weekly - 3 sets - 10 reps Standing Shoulder Flexion to 90 Degrees with Dumbbells - 1 x daily - 7 x weekly - 3 sets - 10 reps Scaption with Dumbbells - 1 x daily - 7 x weekly - 3 sets - 10 reps Standing Shoulder Internal Rotation with Anchored Resistance - 1 x daily - 7 x weekly - 3 sets - 10 reps Side Stepping with Resistance at Feet - 1 x daily - 7 x weekly - 3 sets - 10 reps Hip Extension with Resistance Loop - 1 x daily - 7 x weekly - 3 sets - 10 reps Squat with Chair Touch - 1 x daily - 7 x weekly - 3 sets - 10 reps

## 2020-03-10 ENCOUNTER — Telehealth (INDEPENDENT_AMBULATORY_CARE_PROVIDER_SITE_OTHER): Payer: 59 | Admitting: Adult Health

## 2020-03-10 DIAGNOSIS — Z9989 Dependence on other enabling machines and devices: Secondary | ICD-10-CM

## 2020-03-10 DIAGNOSIS — G4733 Obstructive sleep apnea (adult) (pediatric): Secondary | ICD-10-CM | POA: Diagnosis not present

## 2020-03-10 NOTE — Progress Notes (Signed)
PATIENT: Fred Thompson DOB: 1960/05/18  REASON FOR VISIT: follow up HISTORY FROM: patient  Virtual Visit via Video Note  I connected with Fred Thompson on 03/10/20 at  1:30 PM EST by a video enabled telemedicine application located remotely at [facility] and verified that I am speaking with the correct person using two identifiers who was located at their own home.   I discussed the limitations of evaluation and management by telemedicine and the availability of in person appointments. The patient expressed understanding and agreed to proceed.   PATIENT: Fred Thompson DOB: 03-01-1961  REASON FOR VISIT: follow up HISTORY FROM: patient  HISTORY OF PRESENT ILLNESS: Today 03/10/20:  [name] is a 59 year old male with a history of obstructive sleep apnea on CPAP.  He returns today for follow-up.  His download indicates that he use his machine 23 out of 30 days for compliance of 77%.  He uses machine greater than 4 hours 21 days for compliance of 70%.  On average he uses his machine 5 hours and 59 minutes.  His residual AHI is 2 on 12 cm of water with EPR 3.  Leak in the 95th percentile is 3 L/min.  Reports that the CPAP is working well however he is not crazy about his mask.  He returns today for an evaluation.  HISTORY 03/10/19:  [name] is a 59 year old male with a history of obstructive sleep apnea on CPAP.  He returns today for follow-up.  His download indicates that he use his machine 30 out of 30 days for compliance of 100%.  He uses machine greater than 4 hours 27 days for compliance of 90%.  On average he uses his machine 7 hours and 7 minutes.  His residual AHI is 1.7 on 12 cm of water with EPR of 3.  His leak in the 95th percentile is 2.2 L/min.  He states that he does not like his head strap however he defers on a mask refitting to try different style of mask.  He returns today for an evaluation  REVIEW OF SYSTEMS: Out of a complete 14  system review of symptoms, the patient complains only of the following symptoms, and all other reviewed systems are negative.  See HPI  ALLERGIES: Allergies  Allergen Reactions  . Atorvastatin Other (See Comments)    Fatigue, myalgias Other reaction(s): Other (See Comments) Fatigue, myalgias    HOME MEDICATIONS: Outpatient Medications Prior to Visit  Medication Sig Dispense Refill  . brimonidine (ALPHAGAN) 0.2 % ophthalmic solution Place 1 drop into the right eye in the morning and at bedtime.     . Cholecalciferol (D3 SUPER STRENGTH) 2000 units CAPS Take 2,000 Units by mouth daily.    . diazepam (VALIUM) 5 MG tablet Take 1 tablet (5 mg total) by mouth every 8 (eight) hours as needed for muscle spasms (inability to urinate). 10 tablet 0  . diltiazem (DILACOR XR) 120 MG 24 hr capsule Take 1 capsule (120 mg total) by mouth daily. 90 capsule 1  . dorzolamide-timolol (COSOPT) 22.3-6.8 MG/ML ophthalmic solution Place 1 drop into the right eye 2 (two) times daily.    . finasteride (PROSCAR) 5 MG tablet Take 5 mg by mouth daily.    . Misc Natural Products (FIBER 7 PO) Take by mouth. 5 in am, 5 in pm    . oxyCODONE (OXY IR/ROXICODONE) 5 MG immediate release tablet Take 1-2 tablets (5-10 mg total) by mouth every 6 (six) hours as  needed. 50 tablet 0  . rosuvastatin (CRESTOR) 10 MG tablet Take 1 tablet (10 mg total) by mouth daily. 90 tablet 3  . TIADYLT ER 120 MG 24 hr capsule TAKE 1 CAPSULE BY MOUTH EVERY DAY 90 capsule 2  . vitamin E 400 UNIT capsule Take 400 Units by mouth daily.    . Zinc 10 MG LOZG Use as directed in the mouth or throat daily.     No facility-administered medications prior to visit.    PAST MEDICAL HISTORY: Past Medical History:  Diagnosis Date  . Allergic rhinitis 01/20/2016  . Bell's palsy yrs ago  . BPH (benign prostatic hyperplasia) 01/01/2016  . Dyslipidemia (high LDL; low HDL) 01/01/2016  . 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 on diltiazem  . Hemorrhoids 01/20/2016   . LV dysfunction    a. echo 12/2015: EF 45-50%, mild global HK, normal diastolic function, mildly dilated LA, trace MR, trace TR, unable to estimate PASP  . PAF (paroxysmal atrial fibrillation) (HCC)    a. diagnosed in 12/2015; b. failed flecainide and multaq; c. CHADS2VASc => 1 (CHF); d. on xarelto; e. s/p successful Afib ablation 09/29/2016  . Paroxysmal atrial flutter (HCC) 01/02/2016  . PVC (premature ventricular contraction)   . Sleep apnea     PAST SURGICAL HISTORY: Past Surgical History:  Procedure Laterality Date  . APPENDECTOMY  1973  . ATRIAL FIBRILLATION ABLATION N/A 09/29/2016   Procedure: Atrial Fibrillation Ablation;  Surgeon: Regan Lemming, MD;  Location: [facility] INVASIVE CV LAB;  Service: Cardiovascular;  Laterality: N/A;  . CATARACT EXTRACTION Right 09/2017   has had infection, then oil bubble, the gas bubble 02/06/2018  . KNEE SURGERY Right 1999   arthroscopic  . NM RENAL LASIX ([facility] HX) Bilateral 2007  . TRANSANAL HEMORRHOIDAL DEARTERIALIZATION N/A 07/08/2019   Procedure: TRANSANAL HEMORRHOIDAL DEARTERIALIZATION;  Surgeon: Romie Levee, MD;  Location[facility];  Service: General;  Laterality: N/A;    FAMILY HISTORY: Family History  Problem Relation Age of Onset  . CAD Father        quad bypass @ 21  . Healthy Sister     SOCIAL HISTORY: Social History   Socioeconomic History  . Marital status: Married    Spouse name: Not on file  . Number of children: 1  . Years of education: Not on file  . Highest education level: Not on file  Occupational History  . Not on file  Tobacco Use  . Smoking status: Never Smoker  . Smokeless tobacco: Never Used  Vaping Use  . Vaping Use: Never used  Substance and Sexual Activity  . Alcohol use: Yes    Comment: occasional  . Drug use: Not Currently  . Sexual activity: Not on file  Other Topics Concern  . Not on file  Social History Narrative  . Not on file   Social Determinants of Health    Financial Resource Strain: Not on file  Food Insecurity: Not on file  Transportation Needs: Not on file  Physical Activity: Not on file  Stress: Not on file  Social Connections: Not on file  Intimate Partner Violence: Not on file      PHYSICAL EXAM Generalized: Well developed, in no acute distress   Neurological examination  Mentation: Alert oriented to time, place, history taking. Follows all commands speech and language fluent Cranial nerve II-XII:Extraocular movements were full. Facial symmetry noted. uvula tongue midline. Head turning and shoulder shrug  were normal and symmetric. Motor: Good strength throughout subjectively  per patient Sensory: Sensory testing is intact to soft touch on all 4 extremities subjectively per patient Coordination: Cerebellar testing reveals good finger-nose-finger  Gait and station: Patient is able to stand from a seated position. gait is normal.  Reflexes: UTA  DIAGNOSTIC DATA (LABS, IMAGING, TESTING) - I reviewed patient records, labs, notes, testing and imaging myself where available.  Lab Results  Component Value Date   WBC 5.6 09/30/2016   HGB 15.6 07/08/2019   HCT 46.0 07/08/2019   MCV 86.1 09/30/2016   PLT 181 09/30/2016      Component Value Date/Time   NA 141 07/08/2019 1250   K 3.9 07/08/2019 1250   CL 105 07/08/2019 1250   CO2 24 09/29/2016 1028   GLUCOSE 90 07/08/2019 1250   BUN 13 07/08/2019 1250   CREATININE 0.90 07/08/2019 1250   CALCIUM 8.7 (L) 09/29/2016 1028   PROT 7.2 04/06/2017 0738   ALBUMIN 4.4 04/06/2017 0738   AST 22 04/06/2017 0738   ALT 26 04/06/2017 0738   ALKPHOS 77 04/06/2017 0738   BILITOT 0.3 04/06/2017 0738   GFRNONAA >60 09/29/2016 1028   GFRAA >60 09/29/2016 1028   Lab Results  Component Value Date   CHOL 144 04/06/2017   HDL 47 04/06/2017   LDLCALC 82 04/06/2017   TRIG 74 04/06/2017   CHOLHDL 3.1 04/06/2017      ASSESSMENT AND PLAN 59 y.o. year old male  has a past medical history  of Allergic rhinitis (01/20/2016), Bell's palsy (yrs ago), BPH (benign prostatic hyperplasia) (01/01/2016), Dyslipidemia (high LDL; low HDL) (01/01/2016), Frequent PVCs (01/02/2016), Hemorrhoids (01/20/2016), LV dysfunction, PAF (paroxysmal atrial fibrillation) (HCC), Paroxysmal atrial flutter (HCC) (01/02/2016), PVC (premature ventricular contraction), and Sleep apnea. here with:  OSA on CPAP  . CPAP compliance excellent . Residual AHI is good . Order sent for mask refitting . Encouraged patient to continue using CPAP nightly and > 4 hours each night . F/U in 1 year or sooner if needed  I spent 20 minutes of face-to-face and non-face-to-face time with patient.  This included previsit chart review, lab review, study review, order entry, electronic health record documentation, patient education.  Butch Penny, MSN, NP-C 03/10/2020, 1:33 PM [facility] [address], [address] [place], [place] [address] [phone]

## 2020-03-25 ENCOUNTER — Encounter: Payer: Self-pay | Admitting: Cardiology

## 2020-03-25 ENCOUNTER — Ambulatory Visit (INDEPENDENT_AMBULATORY_CARE_PROVIDER_SITE_OTHER): Payer: 59 | Admitting: Cardiology

## 2020-03-25 ENCOUNTER — Other Ambulatory Visit: Payer: Self-pay

## 2020-03-25 VITALS — BP 130/74 | HR 67 | Ht 77.0 in | Wt 224.0 lb

## 2020-03-25 DIAGNOSIS — I480 Paroxysmal atrial fibrillation: Secondary | ICD-10-CM

## 2020-03-25 NOTE — Progress Notes (Signed)
Electrophysiology Office Note   Date:  03/25/2020   ID:  Desten, Manor [birth year], MRN [id number]  PCP:  [name], MD  Cardiologist:  Colan Neptune Primary Electrophysiologist:  Andrik Sandt Jorja Loa, MD    No chief complaint on file.    History of Present Illness: Fred Thompson is a 60 y.o. male who is being seen today for the evaluation of atrial fibrillation/flutter at the request of [name], [name], MD. Presenting today for electrophysiology evaluation.    He was initially diagnosed with atrial fibrillation October 2017.  He was diagnosed with symptoms of dizziness, diaphoresis.  He was at a fundraiser and had an episode of syncope at the time.  He is placed on flecainide but did not tolerate the medication due to fatigue.  He has not status post AF ablation 09/29/2016.  Today, denies symptoms of palpitations, chest pain, shortness of breath, orthopnea, PND, lower extremity edema, claudication, dizziness, presyncope, syncope, bleeding, or neurologic sequela. The patient is tolerating medications without difficulties.  Since last being seen he has done well.  He has no chest pain or shortness of breath.  He is able to do all of his daily activities without restriction.  He is noted no further episodes of atrial fibrillation.  Past Medical History:  Diagnosis Date  . Allergic rhinitis 01/20/2016  . Bell's palsy yrs ago  . BPH (benign prostatic hyperplasia) 01/01/2016  . Dyslipidemia (high LDL; low HDL) 01/01/2016  . 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 on diltiazem  . Hemorrhoids 01/20/2016  . LV dysfunction    a. echo 12/2015: EF 45-50%, mild global HK, normal diastolic function, mildly dilated LA, trace MR, trace TR, unable to estimate PASP  . PAF (paroxysmal atrial fibrillation) (HCC)    a. diagnosed in 12/2015; b. failed flecainide and multaq; c. CHADS2VASc => 1 (CHF); d. on xarelto; e. s/p successful Afib ablation 09/29/2016  . Paroxysmal atrial flutter (HCC) 01/02/2016  .  PVC (premature ventricular contraction)   . Sleep apnea    Past Surgical History:  Procedure Laterality Date  . APPENDECTOMY  1973  . ATRIAL FIBRILLATION ABLATION N/A 09/29/2016   Procedure: Atrial Fibrillation Ablation;  Surgeon: Regan Lemming, MD;  Location: [facility] INVASIVE CV LAB;  Service: Cardiovascular;  Laterality: N/A;  . CATARACT EXTRACTION Right 09/2017   has had infection, then oil bubble, the gas bubble 02/06/2018  . KNEE SURGERY Right 1999   arthroscopic  . NM RENAL LASIX ([facility] HX) Bilateral 2007  . TRANSANAL HEMORRHOIDAL DEARTERIALIZATION N/A 07/08/2019   Procedure: TRANSANAL HEMORRHOIDAL DEARTERIALIZATION;  Surgeon: Romie Levee, MD;  Location: [facility];  Service: General;  Laterality: N/A;     Current Outpatient Medications  Medication Sig Dispense Refill  . brimonidine (ALPHAGAN) 0.2 % ophthalmic solution Place 1 drop into the right eye in the morning and at bedtime.     . Cholecalciferol (D3 SUPER STRENGTH) 2000 units CAPS Take 2,000 Units by mouth daily.    Marland Kitchen diltiazem (DILACOR XR) 120 MG 24 hr capsule Take 1 capsule (120 mg total) by mouth daily. 90 capsule 1  . dorzolamide-timolol (COSOPT) 22.3-6.8 MG/ML ophthalmic solution Place 1 drop into the right eye 2 (two) times daily.    . finasteride (PROSCAR) 5 MG tablet Take 5 mg by mouth daily.    . Misc Natural Products (FIBER 7 PO) Take by mouth. 5 in am, 5 in pm    . rosuvastatin (CRESTOR) 10 MG tablet Take 1 tablet (10 mg total)  by mouth daily. 90 tablet 3  . TIADYLT ER 120 MG 24 hr capsule TAKE 1 CAPSULE BY MOUTH EVERY DAY 90 capsule 2  . vitamin E 400 UNIT capsule Take 400 Units by mouth daily.    . Zinc 10 MG LOZG Use as directed in the mouth or throat daily.     No current facility-administered medications for this visit.    Allergies:   Atorvastatin   Social History:  The patient  reports that he has never smoked. He has never used smokeless tobacco. He reports current alcohol use. He  reports previous drug use.   Family History:  The patient's family history includes CAD in his father; Healthy in his sister.   ROS:  Please see the history of present illness.   Otherwise, review of systems is positive for none.   All other systems are reviewed and negative.   PHYSICAL EXAM: VS:  BP 130/74   Pulse 67   Ht 6\' 5"  (1.956 m)   Wt 224 lb (101.6 kg)   SpO2 95%   BMI 26.56 kg/m  , BMI Body mass index is 26.56 kg/m. GEN: Well nourished, well developed, in no acute distress  HEENT: normal  Neck: no JVD, carotid bruits, or masses Cardiac: RRR; no murmurs, rubs, or gallops,no edema  Respiratory:  clear to auscultation bilaterally, normal work of breathing GI: soft, nontender, nondistended, + BS MS: no deformity or atrophy  Skin: warm and dry Neuro:  Strength and sensation are intact Psych: euthymic mood, full affect  EKG:  EKG is ordered today. Personal review of the ekg ordered shows sinus rhythm, rate 67  Recent Labs: 07/08/2019: BUN 13; Creatinine, Ser 0.90; Hemoglobin 15.6; Potassium 3.9; Sodium 141    Lipid Panel     Component Value Date/Time   CHOL 144 04/06/2017 0738   TRIG 74 04/06/2017 0738   HDL 47 04/06/2017 0738   CHOLHDL 3.1 04/06/2017 0738   LDLCALC 82 04/06/2017 0738     Wt Readings from Last 3 Encounters:  03/25/20 224 lb (101.6 kg)  07/08/19 220 lb 3.2 oz (99.9 kg)  03/10/19 223 lb 3.2 oz (101.2 kg)      Other studies Reviewed: Additional studies/ records that were reviewed today include: ETT 05/15/16  Review of the above records today demonstrates:    Blood pressure demonstrated a hypertensive response to exercise. 1 mm upsloping ST depression in inferior leads with exercise Non diagnostic due to achieving onlyh 82% of PMHR PVC;s and bigemminy with stress HTN response to exercise   TTE 05/21/18 Left ventricle cavity is normal in size. Moderate concentric hypertrophy of the left ventricle. Mild decrease in global wall motion. Visual  EF is 45-50%. Doppler evidence of grade I (impaired) diastolic dysfunction, normal LAP. Calculated EF 40%. No significant valvular abnormality.  IVC is dilated with respiratory variation. This may suggest elevated right heart pressure. No significant change compared to previous study on 04/18/2017.  Holter 01/18/17 - personally reviewed Minimum HR: 71 BPM at 9:54:24 PM Maximum HR: 134 BPM at 12:56:24 PM(2) Average HR: 86 BPM <1% PVCs, <1% APCs 6 beat run of likely atrial tachycardia Sinus rhythm without other arrhythmia  ASSESSMENT AND PLAN:  1.  Atrial fibrillation/flutter: Currently not anticoagulated.  Status post ablation 09/29/2016.  CHA2DS2-VASc 1.  He has noted no further episodes of atrial fibrillation.  He is currently feeling well.  We Avonda Toso hold off on anticoagulation.  No changes.  2.  PVCs: Low burden on cardiac monitor.  No changes.  3.  Mild LV dysfunction: Plan per primary cardiology  Current medicines are reviewed at length with the patient today.   The patient does not have concerns regarding his medicines.  The following changes were made today: None  Labs/ tests ordered today include:  Orders Placed This Encounter  Procedures  . EKG 12-Lead     Disposition:   FU with Pamalee Marcoe 12 month  Signed, Abdel Effinger Meredith Leeds, MD  03/25/2020 11:04 AM     [facility] [address] [place] [address] [phone] (office) [phone] (fax)

## 2020-04-13 ENCOUNTER — Other Ambulatory Visit: Payer: Self-pay | Admitting: Cardiology

## 2020-04-13 DIAGNOSIS — E785 Hyperlipidemia, unspecified: Secondary | ICD-10-CM

## 2020-04-17 ENCOUNTER — Other Ambulatory Visit: Payer: Self-pay | Admitting: Cardiology

## 2020-04-17 DIAGNOSIS — Z8679 Personal history of other diseases of the circulatory system: Secondary | ICD-10-CM

## 2020-04-20 ENCOUNTER — Other Ambulatory Visit: Payer: Self-pay

## 2020-04-20 DIAGNOSIS — Z8679 Personal history of other diseases of the circulatory system: Secondary | ICD-10-CM

## 2020-04-20 MED ORDER — DILTIAZEM HCL ER BEADS 120 MG PO CP24: 120.0000 mg | ORAL_CAPSULE | Freq: Every day | ORAL | 1 refills | Status: DC

## 2020-05-07 ENCOUNTER — Encounter: Payer: Self-pay | Admitting: Cardiology

## 2020-05-07 ENCOUNTER — Ambulatory Visit: Payer: 59 | Admitting: Cardiology

## 2020-05-07 ENCOUNTER — Other Ambulatory Visit: Payer: Self-pay

## 2020-05-07 VITALS — BP 131/82 | HR 68 | Temp 98.0°F | Resp 17 | Ht 77.0 in | Wt 223.0 lb

## 2020-05-07 DIAGNOSIS — G4733 Obstructive sleep apnea (adult) (pediatric): Secondary | ICD-10-CM

## 2020-05-07 DIAGNOSIS — Z8679 Personal history of other diseases of the circulatory system: Secondary | ICD-10-CM

## 2020-05-07 DIAGNOSIS — E7800 Pure hypercholesterolemia, unspecified: Secondary | ICD-10-CM

## 2020-05-07 DIAGNOSIS — I428 Other cardiomyopathies: Secondary | ICD-10-CM

## 2020-05-07 MED ORDER — DILTIAZEM HCL ER BEADS 120 MG PO CP24: 120.0000 mg | ORAL_CAPSULE | Freq: Every day | ORAL | 3 refills | Status: DC

## 2020-05-07 NOTE — Progress Notes (Signed)
Subjective:  Primary Physician:  Lujean Amel, MD  Patient ID: Fred Thompson, male    DOB: 12-08-1960, 60 y.o.   MRN: [id number]  Chief Complaint  Patient presents with  . Medication Management  . Atrial Fibrillation  . Medication Management    HPI: Fred Thompson  is a 60 y.o. male  with paroxysmal atrial fibrillation, paroxysmal atrial flutter who initially presented with syncope on 01/01/2016. He was unable to tolerate flecainide, Multac, due to marked fatigue and also efficacy, underwent atrial fibrillation ablation on 09/29/2016.  He is essentially asymptomatic and presents here to discuss medications.  He has resumed all his activities without any limitations.  He has been compliant with CPAP 100%.  Past Medical History:  Diagnosis Date  . Allergic rhinitis 01/20/2016  . Bell's palsy yrs ago  . BPH (benign prostatic hyperplasia) 01/01/2016  . Dyslipidemia (high LDL; low HDL) 01/01/2016  . 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 on diltiazem  . Hemorrhoids 01/20/2016  . LV dysfunction    a. echo 12/2015: EF 45-50%, mild global HK, normal diastolic function, mildly dilated LA, trace MR, trace TR, unable to estimate PASP  . PAF (paroxysmal atrial fibrillation) ([place])    a. diagnosed in 12/2015; b. failed flecainide and multaq; c. CHADS2VASc => 1 (CHF); d. on xarelto; e. s/p successful Afib ablation 09/29/2016  . Paroxysmal atrial flutter ([place]) 01/02/2016  . PVC (premature ventricular contraction)   . Sleep apnea     Past Surgical History:  Procedure Laterality Date  . APPENDECTOMY  1973  . ATRIAL FIBRILLATION ABLATION N/A 09/29/2016   Procedure: Atrial Fibrillation Ablation;  Surgeon: Constance Haw, MD;  Location: [facility];  Service: Cardiovascular;  Laterality: N/A;  . CATARACT EXTRACTION Right 09/2017   has had infection, then oil bubble, the gas bubble 02/06/2018  . KNEE SURGERY Right 1999   arthroscopic  . NM RENAL LASIX ([place] HX) Bilateral 2007  .  TRANSANAL HEMORRHOIDAL DEARTERIALIZATION N/A 07/08/2019   Procedure: TRANSANAL HEMORRHOIDAL DEARTERIALIZATION;  Surgeon: Leighton Ruff, MD;  Location: [facility];  Service: General;  Laterality: N/A;    Social History   Socioeconomic History  . Marital status: Married    Spouse name: Not on file  . Number of children: 1  . Years of education: Not on file  . Highest education level: Not on file  Occupational History  . Not on file  Tobacco Use  . Smoking status: Never Smoker  . Smokeless tobacco: Never Used  Vaping Use  . Vaping Use: Never used  Substance and Sexual Activity  . Alcohol use: Yes    Comment: occasional  . Drug use: Not Currently  . Sexual activity: Not on file  Other Topics Concern  . Not on file  Social History Narrative  . Not on file   Social Determinants of Health   Financial Resource Strain: Not on file  Food Insecurity: Not on file  Transportation Needs: Not on file  Physical Activity: Not on file  Stress: Not on file  Social Connections: Not on file  Intimate Partner Violence: Not on file    Current Outpatient Medications on File Prior to Visit  Medication Sig Dispense Refill  . brimonidine (ALPHAGAN) 0.2 % ophthalmic solution Place 1 drop into the right eye in the morning and at bedtime.     . Cholecalciferol 50 MCG (2000 UT) CAPS Take 2,000 Units by mouth daily.    . dorzolamide-timolol (COSOPT) 22.3-6.8 MG/ML ophthalmic  solution Place 1 drop into the right eye 2 (two) times daily.    . finasteride (PROSCAR) 5 MG tablet Take 5 mg by mouth daily.    . meloxicam (MOBIC) 15 MG tablet Take 15 mg by mouth as needed.    . Misc Natural Products (FIBER 7 PO) Take by mouth. 5 in am, 5 in pm    . rosuvastatin (CRESTOR) 10 MG tablet TAKE 1 TABLET BY MOUTH EVERY DAY 90 tablet 3  . vitamin E 400 UNIT capsule Take 400 Units by mouth daily.     No current facility-administered medications on file prior to visit.   Review of Systems   Respiratory: Negative.   Cardiovascular: Negative for chest pain, palpitations and orthopnea.  Gastrointestinal: Negative for blood in stool.  Neurological: Negative.        Objective:  Blood pressure 131/82, pulse 68, temperature 98 F (36.7 C), resp. rate 17, height $RemoveBe'6\' 5"'mnTEpduTF$  (1.956 m), weight 223 lb (101.2 kg), SpO2 96 %. Body mass index is 26.44 kg/m.   Physical Exam Constitutional:      General: He is not in acute distress.    Appearance: He is well-developed.  Neck:     Thyroid: No thyromegaly.     Vascular: No JVD.  Cardiovascular:     Rate and Rhythm: Normal rate and regular rhythm.     Pulses: Intact distal pulses.     Heart sounds: Normal heart sounds. No murmur heard. No gallop.   Pulmonary:     Effort: Pulmonary effort is normal.     Breath sounds: Normal breath sounds.  Abdominal:     General: Bowel sounds are normal.     Palpations: Abdomen is soft.  Musculoskeletal:        General: Normal range of motion.     Cervical back: Neck supple.  Skin:    General: Skin is warm and dry.  Neurological:     Mental Status: He is alert.    CMP Latest Ref Rng & Units 07/08/2019 04/06/2017 09/29/2016  Glucose 70 - 99 mg/dL 90 - 98  BUN 6 - 20 mg/dL 13 - 10  Creatinine 0.61 - 1.24 mg/dL 0.90 - 1.00  Sodium 135 - 145 mmol/L 141 - 137  Potassium 3.5 - 5.1 mmol/L 3.9 - 3.8  Chloride 98 - 111 mmol/L 105 - 107  CO2 22 - 32 mmol/L - - 24  Calcium 8.9 - 10.3 mg/dL - - 8.7(L)  Total Protein 6.0 - 8.5 g/dL - 7.2 -  Total Bilirubin 0.0 - 1.2 mg/dL - 0.3 -  Alkaline Phos 39 - 117 IU/L - 77 -  AST 0 - 40 IU/L - 22 -  ALT 0 - 44 IU/L - 26 -   CBC Latest Ref Rng & Units 07/08/2019 09/30/2016 09/29/2016  WBC 4.0 - 10.5 K/uL - 5.6 3.4(L)  Hemoglobin 13.0 - 17.0 g/dL 15.6 11.2(L) 12.2(L)  Hematocrit 39.0 - 52.0 % 46.0 34.7(L) 37.2(L)  Platelets 150 - 400 K/uL - 181 187    External labs:   Labs 01/02/2020:  Serum glucose 98 mg, BUN 13, creatinine 0.98, EGFR 78 mL, potassium  4.6.  CMP otherwise normal.  Total cholesterol 162, triglycerides 102, HDL 40, LDL 103.  Non-HDL cholesterol 122.  CARDIAC STUDIES:   Exercise sestamibi stress test 07/14/2016: 1. Resting EKG demonstrates atypical atrial flutter with variable ventricular response.  Stress EKG was negative for myocardial ischemia.  Patient appeared to have developed atrial fibrillation with peak exercise which reverted  to atypical atrial flutter versus coarse atrial fibrillation in recovery.  Rare PVC.  Nonspecific ST segment changes noted at both rest and stress.  Patient exercised for 7:30 minutes and achieved 10.35 METs.  Normal blood pressure response. 2. Left ventricular cavity is noted to be enlarged on the rest and stress studies and measured 142 mL.  SPECT images demonstrate homogeneous tracer distribution throughout the myocardium.  Gated SPECT imaging demonstrates global hypokinesis. The left ventricular ejection fraction was calculated or visually estimated to be 32%.  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 This is an intermediate risk study, clinical correlation recommended.  Echocardiogram 05/16/2018: Left ventricle cavity is normal in size. Moderate concentric hypertrophy of the left ventricle. Mild decrease in global wall motion. Visual EF is 45-50%. Doppler evidence of grade I (impaired) diastolic dysfunction, normal LAP. Calculated EF 40%. No significant valvular abnormality.  IVC is dilated with respiratory variation. This may suggest elevated right heart pressure. No significant change compared to previous study on 04/18/2017.   EKG:   EKG 03/25/2020: Normal sinus rhythm at rate of 67 bpm, normal axis.  No evident ischemia, normal EKG. no significant change from 05/03/2018.  Assessment & Recommendations:       ICD-10-CM   1. Non-ischemic cardiomyopathy ([place])  I42.8 PCV ECHOCARDIOGRAM COMPLETE  2. H/O atrial fibrillation without current medication  Z86.79 diltiazem (TIADYLT ER)  120 MG 24 hr capsule    PCV ECHOCARDIOGRAM COMPLETE  3. Hypercholesteremia  E78.00   4. OSA on CPAP  G47.33    Z99.89     LUNDON VERDEJO  is a 61 y.o.  male  with paroxysmal atrial fibrillation, paroxysmal atrial flutter who initially presented with syncope on 01/01/2016. He was unable to tolerate flecainide, Multac, due to marked fatigue and also efficacy, underwent atrial fibrillation ablation on 09/29/2016.  He is essentially asymptomatic and presents here to discuss medications.  He has resumed all his activities without any limitations.  He has been compliant with CPAP 100%.  Recommendation:  1. Dilated cardiomyopathy ([place]) I suspect his dilated cardiomyopathy was related to underlying atrial fibrillation.  He is been maintaining sinus rhythm for the past 4 years since atrial fibrillation ablation in 2018.    I'll repeat echocardiogram, I suspect his dilated cardio myopathy is resolved now, physical exam completely normal and is essentially asymptomatic EKG performed a month ago at Dr. Curt Bears office was normal.  Although we could certainly discontinue diltiazem, as he has maintained sinus rhythm, after discussions, we decided to continue with diltiazem CD at a low dose as he is tolerating this without any side effects.  Prior to ablation, he also used to have frequent episodes of frequent PVCs and since atrial fibrillation ablation this is also completely resolved.  Repeating echocardiogram be appropriate to reevaluate his LVEF.  With regard to obstructive sleep apnea, follows Dr. Brett Fairy, he has been compliant.  He is also maintained weight loss.  With regard to hyperlipidemia, I reviewed his external labs, LDL is minimally elevated, we discussed regarding lifestyle changes to be done but in the absence of any other risk factors including hypertension, family history of premature coronary disease, or diabetes or smoking, I did not make any changes to his medication.  I will see him  back in a year and if echocardiogram is normal, I will see him back on a as needed basis.  If LVEF is still reduced, could consider switching from diltiazem to metoprolol.   Adrian Prows, MD, [facility] 05/07/2020, 2:21 PM Office:  [phone] Pager: [phone]

## 2020-05-28 ENCOUNTER — Other Ambulatory Visit: Payer: 59

## 2020-06-16 ENCOUNTER — Other Ambulatory Visit: Payer: Self-pay

## 2020-06-16 ENCOUNTER — Ambulatory Visit: Payer: 59

## 2020-06-16 DIAGNOSIS — Z8679 Personal history of other diseases of the circulatory system: Secondary | ICD-10-CM

## 2020-06-16 DIAGNOSIS — I428 Other cardiomyopathies: Secondary | ICD-10-CM

## 2020-06-22 NOTE — Progress Notes (Signed)
Stable echocardiogram.  We will discuss more on your office visit.

## 2021-01-31 ENCOUNTER — Other Ambulatory Visit: Payer: Self-pay | Admitting: *Deleted

## 2021-01-31 DIAGNOSIS — E785 Hyperlipidemia, unspecified: Secondary | ICD-10-CM

## 2021-01-31 MED ORDER — ROSUVASTATIN CALCIUM 10 MG PO TABS: 10.0000 mg | ORAL_TABLET | Freq: Every day | ORAL | 0 refills | Status: DC

## 2021-02-02 ENCOUNTER — Other Ambulatory Visit: Payer: Self-pay

## 2021-02-02 DIAGNOSIS — Z8679 Personal history of other diseases of the circulatory system: Secondary | ICD-10-CM

## 2021-02-02 MED ORDER — DILTIAZEM HCL ER BEADS 120 MG PO CP24: 120.0000 mg | ORAL_CAPSULE | Freq: Every day | ORAL | 3 refills | Status: DC

## 2021-03-08 ENCOUNTER — Telehealth: Payer: Self-pay | Admitting: Adult Health

## 2021-03-08 NOTE — Telephone Encounter (Signed)
Noted  

## 2021-03-08 NOTE — Telephone Encounter (Signed)
..   Pt understands that although there may be some limitations with this type of visit, we will take all precautions to reduce any security or privacy concerns.  Pt understands that this will be treated like an in office visit and we will file with pt's insurance, and there may be a patient responsible charge related to this service. ? ?

## 2021-03-09 ENCOUNTER — Telehealth (INDEPENDENT_AMBULATORY_CARE_PROVIDER_SITE_OTHER): Payer: BC Managed Care – PPO | Admitting: Adult Health

## 2021-03-09 ENCOUNTER — Encounter: Payer: Self-pay | Admitting: *Deleted

## 2021-03-09 ENCOUNTER — Telehealth: Payer: Self-pay | Admitting: Neurology

## 2021-03-09 DIAGNOSIS — G4733 Obstructive sleep apnea (adult) (pediatric): Secondary | ICD-10-CM | POA: Diagnosis not present

## 2021-03-09 DIAGNOSIS — Z9989 Dependence on other enabling machines and devices: Secondary | ICD-10-CM | POA: Diagnosis not present

## 2021-03-09 DIAGNOSIS — I4892 Unspecified atrial flutter: Secondary | ICD-10-CM

## 2021-03-09 DIAGNOSIS — R55 Syncope and collapse: Secondary | ICD-10-CM

## 2021-03-09 NOTE — Telephone Encounter (Signed)
In order to evaluate if NSPIRE could be an alternative to CPAP for this patient , I will need a new baseline HST.  I ordered this HST today after the patient finished a virtual visit with NP [place].  Melvyn Novas, MD

## 2021-03-09 NOTE — Progress Notes (Signed)
I agree with the assessment and plan as directed by NP on this visit . I was available for consultation.   Medora Roorda, MD  

## 2021-03-09 NOTE — Telephone Encounter (Signed)
LVM at 9:40am with reminder of MyChart video visit today at 10:00am. Informed pt log on 15 mins early to make sure mic and camera is working, if have any questions please [place] call.

## 2021-03-09 NOTE — Progress Notes (Addendum)
PATIENT: Fred Thompson DOB: 1960-06-22  REASON FOR VISIT: follow up HISTORY FROM: patient PRIMARY NEUROLOGIST:   Virtual Visit via Video Note  I connected with Eual Fines on 03/09/21 at 10:00 AM EST by a video enabled telemedicine application located remotely at [facility] and verified that I am speaking with the correct person using two identifiers who was located at their own home.   I discussed the limitations of evaluation and management by telemedicine and the availability of in person appointments. The patient expressed understanding and agreed to proceed.   PATIENT: Fred Thompson DOB: [birth year]  REASON FOR VISIT: follow up HISTORY FROM: patient  HISTORY OF PRESENT ILLNESS: Today 03/09/21  Mr. Encina is a 60 year old male with a history of obstructive sleep apnea on CPAP.  He returns today for follow-up.  He reports that the CPAP is working well for him however he does not like using it.  He states that all the cords and mask are aggravating but he does acknowledge that he sleeps better with the machine.  He is interested in inspire device    REVIEW OF SYSTEMS: Out of a complete 14 system review of symptoms, the patient complains only of the following symptoms, and all other reviewed systems are negative.  ALLERGIES: Allergies  Allergen Reactions   Atorvastatin Other (See Comments)    Fatigue, myalgias Other reaction(s): Other (See Comments) Fatigue, myalgias    HOME MEDICATIONS: Outpatient Medications Prior to Visit  Medication Sig Dispense Refill   brimonidine (ALPHAGAN) 0.2 % ophthalmic solution Place 1 drop into the right eye in the morning and at bedtime.      Cholecalciferol 50 MCG (2000 UT) CAPS Take 2,000 Units by mouth daily.     diltiazem (TIADYLT ER) 120 MG 24 hr capsule Take 1 capsule (120 mg total) by mouth daily. 90 capsule 3   dorzolamide-timolol (COSOPT) 22.3-6.8 MG/ML ophthalmic solution Place 1 drop into the  right eye 2 (two) times daily.     finasteride (PROSCAR) 5 MG tablet Take 5 mg by mouth daily.     meloxicam (MOBIC) 15 MG tablet Take 15 mg by mouth as needed.     Misc Natural Products (FIBER 7 PO) Take by mouth. 5 in am, 5 in pm     rosuvastatin (CRESTOR) 10 MG tablet Take 1 tablet (10 mg total) by mouth daily. 90 tablet 0   vitamin E 400 UNIT capsule Take 400 Units by mouth daily.     No facility-administered medications prior to visit.    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 on diltiazem   Hemorrhoids 01/20/2016   LV dysfunction    a. echo 12/2015: EF 45-50%, mild global HK, normal diastolic function, mildly dilated LA, trace MR, trace TR, unable to estimate PASP   PAF (paroxysmal atrial fibrillation) ([facility])    a. diagnosed in 12/2015; b. failed flecainide and multaq; c. CHADS2VASc => 1 (CHF); d. on xarelto; e. s/p successful Afib ablation 09/29/2016   Paroxysmal atrial flutter ([facility]) 01/02/2016   PVC (premature ventricular contraction)    Sleep apnea     PAST SURGICAL HISTORY: Past Surgical History:  Procedure Laterality Date   APPENDECTOMY  1973   ATRIAL FIBRILLATION ABLATION N/A 09/29/2016   Procedure: Atrial Fibrillation  Ablation;  Surgeon: Regan Lemming, MD;  Location: [facility];  Service: Cardiovascular;  Laterality: N/A;   CATARACT EXTRACTION Right 09/2017   has had infection, then oil bubble, the gas bubble 02/06/2018   KNEE SURGERY Right 1999   arthroscopic   NM RENAL LASIX ([facility] HX) Bilateral 2007   TRANSANAL HEMORRHOIDAL DEARTERIALIZATION N/A 07/08/2019   Procedure: TRANSANAL HEMORRHOIDAL DEARTERIALIZATION;  Surgeon: Romie Levee, MD;  Location: [facility];  Service: General;  Laterality: N/A;    FAMILY HISTORY: Family History   Problem Relation Age of Onset   CAD Father        quad bypass @ 64   Healthy Sister     SOCIAL HISTORY: Social History   Socioeconomic History   Marital status: Married    Spouse name: Not on file   Number of children: 1   Years of education: Not on file   Highest education level: Not on file  Occupational History   Not on file  Tobacco Use   Smoking status: Never   Smokeless tobacco: Never  Vaping Use   Vaping Use: Never used  Substance and Sexual Activity   Alcohol use: Yes    Comment: occasional   Drug use: Not Currently   Sexual activity: Not on file  Other Topics Concern   Not on file  Social History Narrative   Not on file   Social Determinants of Health   Financial Resource Strain: Not on file  Food Insecurity: Not on file  Transportation Needs: Not on file  Physical Activity: Not on file  Stress: Not on file  Social Connections: Not on file  Intimate Partner Violence: Not on file      PHYSICAL EXAM Generalized: Well developed, in no acute distress   Neurological examination  Mentation: Alert oriented to time, place, history taking. Follows all commands speech and language fluent   DIAGNOSTIC DATA (LABS, IMAGING, TESTING) - I reviewed patient records, labs, notes, testing and imaging myself where available.  Lab Results  Component Value Date   WBC 5.6 09/30/2016   HGB 15.6 07/08/2019   HCT 46.0 07/08/2019   MCV 86.1 09/30/2016   PLT 181 09/30/2016      Component Value Date/Time   NA 141 07/08/2019 1250   K 3.9 07/08/2019 1250   CL 105 07/08/2019 1250   CO2 24 09/29/2016 1028   GLUCOSE 90 07/08/2019 1250   BUN 13 07/08/2019 1250   CREATININE 0.90 07/08/2019 1250   CALCIUM 8.7 (L) 09/29/2016 1028   PROT 7.2 04/06/2017 0738   ALBUMIN 4.4 04/06/2017 0738   AST 22 04/06/2017 0738   ALT 26 04/06/2017 0738   ALKPHOS 77 04/06/2017 0738   BILITOT 0.3 04/06/2017 0738   GFRNONAA >60 09/29/2016 1028   GFRAA >60 09/29/2016 1028   Lab  Results  Component Value Date   CHOL 144 04/06/2017   HDL 47 04/06/2017   LDLCALC 82 04/06/2017   TRIG 74 04/06/2017   CHOLHDL 3.1 04/06/2017   No results found for: HGBA1C No results found for: VITAMINB12 No results found for: TSH    ASSESSMENT AND PLAN 60 y.o. year old male  has a past medical history of Allergic rhinitis (01/20/2016), Bell's palsy (yrs ago), BPH (benign prostatic hyperplasia) (01/01/2016), Dyslipidemia (high LDL; low HDL) (01/01/2016), Frequent PVCs (01/02/2016), Hemorrhoids (01/20/2016), LV dysfunction, PAF (paroxysmal atrial fibrillation) ([facility]), Paroxysmal atrial flutter ([facility]) (01/02/2016), PVC (premature ventricular contraction), and Sleep apnea. here with:  OSA on CPAP  CPAP compliance excellent Residual AHI is good Encouraged patient to continue using CPAP nightly and > 4 hours each night Discussed inspire device patient is interested in proceeding with this.  Advised that I will send a message to Dr. Vickey Huger and our office will be in contact with him as to whether he needs an appointment with Dr. Vickey Huger or if a referral has been placed to ENT F/U in 1 year or sooner if needed    Butch Penny, MSN, NP-C 03/09/2021, 9:56 AM [facility] [address], [address] [place], [place] [address] [phone]   Addendum 03-09-2021 at 39 38   Mr. Grays was originally referred in the year 2018 by his cardiologist Dr. Jacinto Halim based on a cardiomyopathy and atrial fibrillation diagnosis.  Goal was to identify if the patient had sleep apnea and to treat sleep apnea so he would not convert back into atrial fibrillation after cardioversion. In 2018-hour home sleep test were performed with an apnea link device and his showed mild sleep apnea overall but more respiratory arousals by snoring, reflected in an higher RDI than AHI.  No significant hypoxemia was noted.  There was a question of central sleep apnea however and he was invited for an in lab  titration to CPAP.  CPAP was successful in reducing his AHI the patient did have obstructive sleep apnea, the apnea remained mild hypoxia was not seen and he has remained on this treatment for the last 4-1/2 years.  I understand that he is now interested in the inspire device and before we order a inspire device we will either repeat a home sleep test on watch pat which is more exact than the older device or invite him for split-night polysomnography to be split at an AHI of 10.  His BMI has to be 32 or less. There is no history of hypoxemia so this would not be an exclusion criteria.  Goal is to make sure that he does not have central apnea and that he is not mainly suffering from hypopneas rather than apneas which indicates a weakness of the diaphragmatic system or chest wall movements.  Those cannot be corrected with inspire.  Melvyn Novas, MD

## 2021-03-10 NOTE — Telephone Encounter (Signed)
Called patient back per Megan Millikin,NP Pt was interested in inspire . Informed patient that he will have to do a HST again in order to see if he qualifies for inspire. Informed patient that he will take another 3-4 weeks to run insurance and make appointment. Pt he understood and thanked me for calling

## 2021-03-15 DIAGNOSIS — H35371 Puckering of macula, right eye: Secondary | ICD-10-CM | POA: Diagnosis not present

## 2021-03-15 DIAGNOSIS — H44001 Unspecified purulent endophthalmitis, right eye: Secondary | ICD-10-CM | POA: Diagnosis not present

## 2021-03-15 DIAGNOSIS — H25812 Combined forms of age-related cataract, left eye: Secondary | ICD-10-CM | POA: Diagnosis not present

## 2021-03-15 DIAGNOSIS — Z9841 Cataract extraction status, right eye: Secondary | ICD-10-CM | POA: Diagnosis not present

## 2021-03-15 DIAGNOSIS — Z9889 Other specified postprocedural states: Secondary | ICD-10-CM | POA: Diagnosis not present

## 2021-03-30 ENCOUNTER — Telehealth: Payer: Self-pay | Admitting: Neurology

## 2021-03-30 NOTE — Telephone Encounter (Signed)
Called pt today to get a copy of his insurance card so we could get authorization for his sleep study. Pt did not have his glasses on so I asked pt to call back with the information needed

## 2021-03-30 NOTE — Telephone Encounter (Signed)
Pt called back to give number off the back of his insurance card. [phone].

## 2021-04-08 ENCOUNTER — Other Ambulatory Visit: Payer: Self-pay | Admitting: Cardiology

## 2021-04-08 DIAGNOSIS — E785 Hyperlipidemia, unspecified: Secondary | ICD-10-CM

## 2021-05-04 ENCOUNTER — Telehealth: Payer: Self-pay | Admitting: Adult Health

## 2021-05-04 NOTE — Telephone Encounter (Signed)
We are still waiting on authorization. Fax was sent on 04/11/21.

## 2021-05-04 NOTE — Telephone Encounter (Signed)
Pt would like a call back to discuss a sleep study. Received a message in MyChart sleep study was approved and that was in December. No one has called to schedule the sleep study.

## 2021-05-04 NOTE — Telephone Encounter (Signed)
Called pt and provided update. He was appreciative of the call.

## 2021-05-06 DIAGNOSIS — R972 Elevated prostate specific antigen [PSA]: Secondary | ICD-10-CM | POA: Diagnosis not present

## 2021-05-09 ENCOUNTER — Ambulatory Visit: Payer: 59 | Admitting: Cardiology

## 2021-05-09 ENCOUNTER — Ambulatory Visit: Payer: BC Managed Care – PPO | Admitting: Cardiology

## 2021-05-09 ENCOUNTER — Encounter: Payer: Self-pay | Admitting: Cardiology

## 2021-05-09 ENCOUNTER — Other Ambulatory Visit: Payer: Self-pay

## 2021-05-09 VITALS — BP 133/80 | HR 73 | Temp 98.2°F | Resp 16 | Ht 77.0 in | Wt 225.0 lb

## 2021-05-09 DIAGNOSIS — Z9989 Dependence on other enabling machines and devices: Secondary | ICD-10-CM

## 2021-05-09 DIAGNOSIS — R931 Abnormal findings on diagnostic imaging of heart and coronary circulation: Secondary | ICD-10-CM

## 2021-05-09 DIAGNOSIS — E7800 Pure hypercholesterolemia, unspecified: Secondary | ICD-10-CM | POA: Diagnosis not present

## 2021-05-09 DIAGNOSIS — G4733 Obstructive sleep apnea (adult) (pediatric): Secondary | ICD-10-CM | POA: Diagnosis not present

## 2021-05-09 DIAGNOSIS — I428 Other cardiomyopathies: Secondary | ICD-10-CM

## 2021-05-09 DIAGNOSIS — Z8679 Personal history of other diseases of the circulatory system: Secondary | ICD-10-CM

## 2021-05-09 MED ORDER — ROSUVASTATIN CALCIUM 10 MG PO TABS: 20.0000 mg | ORAL_TABLET | Freq: Every day | ORAL | 3 refills | Status: DC

## 2021-05-09 NOTE — Progress Notes (Signed)
Subjective:  Primary Physician:  [name], MD  Patient ID: Fred Thompson, male    DOB: 17-Jul-1960, 61 y.o.   MRN: [id number]  Chief Complaint  Patient presents with   Cardiomyopathy   Atrial Fibrillation   Follow-up    1 year    HPI: Fred Thompson  is a 61 y.o. male  with paroxysmal atrial fibrillation, paroxysmal atrial flutter who initially presented with syncope on 01/01/2016.  He was unable to tolerate flecainide, Multac, due to marked fatigue and also efficacy, underwent atrial fibrillation ablation on 09/29/2016. LVEF was mildly reduced.   He is essentially asymptomatic and presents here to discuss medications.  He has resumed all his activities without any limitations.  He has been compliant with CPAP 100%.  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 on diltiazem   Hemorrhoids 01/20/2016   LV dysfunction    a. echo 12/2015: EF 45-50%, mild global HK, normal diastolic function, mildly dilated LA, trace MR, trace TR, unable to estimate PASP   PAF (paroxysmal atrial fibrillation) (HCC)    a. diagnosed in 12/2015; b. failed flecainide and multaq; c. CHADS2VASc => 1 (CHF); d. on xarelto; e. s/p successful Afib ablation 09/29/2016   Paroxysmal atrial flutter (HCC) 01/02/2016   PVC (premature ventricular contraction)    Sleep apnea     Past Surgical History:  Procedure Laterality Date   APPENDECTOMY  1973   ATRIAL FIBRILLATION ABLATION N/A 09/29/2016   Procedure: Atrial Fibrillation Ablation;  Surgeon: Regan Lemming, MD;  Location: [facility];  Service: Cardiovascular;  Laterality: N/A;   CATARACT EXTRACTION Right 09/2017   has had infection, then oil bubble, the gas bubble 02/06/2018   KNEE SURGERY Right 1999   arthroscopic   NM RENAL LASIX ([facility] HX) Bilateral 2007    TRANSANAL HEMORRHOIDAL DEARTERIALIZATION N/A 07/08/2019   Procedure: TRANSANAL HEMORRHOIDAL DEARTERIALIZATION;  Surgeon: Romie Levee, MD;  Location: [facility] [place];  Service: General;  Laterality: N/A;   Social History   Tobacco Use   Smoking status: Never   Smokeless tobacco: Never  Substance Use Topics   Alcohol use: Yes    Comment: occasional  Married    Current Outpatient Medications:    brimonidine (ALPHAGAN) 0.2 % ophthalmic solution, Place 1 drop into the right eye in the morning and at bedtime. , Disp: , Rfl:    cholecalciferol (VITAMIN D3) 25 MCG (1000 UNIT) tablet, Take 1,000 Units by mouth daily., Disp: , Rfl:    diltiazem (TIADYLT ER) 120 MG 24 hr capsule, Take 1 capsule (120 mg total) by mouth daily., Disp: 90 capsule, Rfl: 3   finasteride (PROSCAR) 5 MG tablet, Take 5 mg by mouth daily., Disp: , Rfl:    meloxicam (MOBIC) 15 MG tablet, Take 15 mg by mouth as needed., Disp: , Rfl:    vitamin E 400 UNIT capsule, Take 400 Units by mouth daily., Disp: , Rfl:    rosuvastatin (CRESTOR) 20 MG tablet, Take 1 tablet (20 mg total) by mouth daily., Disp: 90 tablet, Rfl: 3   Review of Systems  Cardiovascular:  Negative for chest pain, dyspnea on exertion and leg swelling.   Objective:  Blood pressure 133/80, pulse 73, temperature 98.2 F (36.8 C), temperature source Temporal, resp. rate 16, height 6\' 5"  (1.956  m), weight 225 lb (102.1 kg), SpO2 98 %. Body mass index is 26.68 kg/m. Vitals with BMI 05/09/2021 05/07/2020 03/25/2020  Height $Remov'6\' 5"'miwGEi$  $Remove'6\' 5"'dcTnDnY$  $RemoveB'6\' 5"'jjlFzjmj$   Weight 225 lbs 223 lbs 224 lbs  BMI 26.68 76.19 50.93  Systolic 267 124 580  Diastolic 80 82 74  Pulse 73 68 67    Physical Exam Neck:     Vascular: No JVD.  Cardiovascular:     Rate and Rhythm: Normal rate and regular rhythm.     Pulses: Intact distal pulses.     Heart sounds: Normal heart sounds. No murmur heard.   No gallop.  Pulmonary:     Effort: Pulmonary effort is normal.     Breath sounds: Normal  breath sounds.  Abdominal:     General: Bowel sounds are normal.     Palpations: Abdomen is soft.  Musculoskeletal:     Right lower leg: No edema.     Left lower leg: No edema.   CMP Latest Ref Rng & Units 07/08/2019 04/06/2017 09/29/2016  Glucose 70 - 99 mg/dL 90 - 98  BUN 6 - 20 mg/dL 13 - 10  Creatinine 0.61 - 1.24 mg/dL 0.90 - 1.00  Sodium 135 - 145 mmol/L 141 - 137  Potassium 3.5 - 5.1 mmol/L 3.9 - 3.8  Chloride 98 - 111 mmol/L 105 - 107  CO2 22 - 32 mmol/L - - 24  Calcium 8.9 - 10.3 mg/dL - - 8.7(L)  Total Protein 6.0 - 8.5 g/dL - 7.2 -  Total Bilirubin 0.0 - 1.2 mg/dL - 0.3 -  Alkaline Phos 39 - 117 IU/L - 77 -  AST 0 - 40 IU/L - 22 -  ALT 0 - 44 IU/L - 26 -   CBC Latest Ref Rng & Units 07/08/2019 09/30/2016 09/29/2016  WBC 4.0 - 10.5 K/uL - 5.6 3.4(L)  Hemoglobin 13.0 - 17.0 g/dL 15.6 11.2(L) 12.2(L)  Hematocrit 39.0 - 52.0 % 46.0 34.7(L) 37.2(L)  Platelets 150 - 400 K/uL - 181 187    External labs:  Labs 12/07/2020:  Hb 14.6/HCT 43.2, platelets 191, normal indicis.  Serum glucose 95 mg, BUN 15, creatinine 0.96, EGFR 89 mL, potassium 4.2, LFTs normal.  TSH normal.  Total cholesterol 166, triglycerides 103, HDL 44, LDL 103.  Non-HDL cholesterol 03/20/2020.  Labs 01/02/2020:  Serum glucose 98 mg, BUN 13, creatinine 0.98, EGFR 78 mL, potassium 4.6.  CMP otherwise normal.  Total cholesterol 162, triglycerides 102, HDL 40, LDL 103.  Non-HDL cholesterol 122.  CARDIAC STUDIES:   Exercise sestamibi stress test 07/14/2016: 1. Resting EKG demonstrates atypical atrial flutter with variable ventricular response.  Stress EKG was negative for myocardial ischemia.  Patient appeared to have developed atrial fibrillation with peak exercise which reverted to atypical atrial flutter versus coarse atrial fibrillation in recovery.  Rare PVC.  Nonspecific ST segment changes noted at both rest and stress.  Patient exercised for 7:30 minutes and achieved 10.35 METs.  Normal blood pressure  response. 2. Left ventricular cavity is noted to be enlarged on the rest and stress studies and measured 142 mL.  SPECT images demonstrate homogeneous tracer distribution throughout the myocardium.  Gated SPECT imaging demonstrates global hypokinesis. The left ventricular ejection fraction was calculated or visually estimated to be 32%.  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 This is an intermediate risk study, clinical correlation recommended.  Coronary calcium score with CT FFR 09/22/2016: Calcium Score:  372 Agatston units. Coronary Arteries: Mild heterogeneous plaque in the coronary arteries.  Right dominant with no anomalies LM:  No significant coronary disease. LAD system: There was mixed plaque in the proximal LAD with mild stenosis. Small D1, moderate D2, small D3 without significant disease. Circumflex system: There was a large ramus present with calcified plaque and mild stenosis present in the proximal vessel. The AV LCx was a relatively small vessel. There was calcified plaque at the ostium without significant stenosis. There was calcified plaque in the mid to distal vessel, probably with mild stenosis (LCx was small in caliber) RCA: Mixed plaque in the mid to distal RCA without significant stenosis.  PCV ECHOCARDIOGRAM COMPLETE 06/16/2020  Narrative Echocardiogram 06/16/2020: Mildly depressed LV systolic function with visual EF 45-50%. Left ventricle cavity is normal in size. Normal global wall motion. Doppler evidence of grade I (impaired) diastolic dysfunction, normal LAP. Left atrial cavity is moderately dilated. IVC is dilated with a respiratory response of <50%, suggest elevated right heart pressure. Compared to prior study dated 05/16/2018: Moderate LAE is new otherwise no significant change.   EKG:  EKG 05/09/2021: Normal sinus rhythm with rate of 76 bpm, frequent PVCs (3).  EKG 03/25/2020: Normal sinus rhythm at rate of 67 bpm, normal axis.  No evident  ischemia, normal EKG.    Assessment & Recommendations:      ICD-10-CM   1. Non-ischemic cardiomyopathy ([place])  I42.8 EKG 12-Lead    2. H/O atrial fibrillation without current medication  Z86.79     3. OSA on CPAP  G47.33    Z99.89     4. Elevated coronary artery calcium score Agatston 382 in 2018  R93.1     5. Hypercholesteremia  E78.00 Lipid Panel With LDL/HDL Ratio    rosuvastatin (CRESTOR) 20 MG tablet    DISCONTINUED: rosuvastatin (CRESTOR) 10 MG tablet     Medications Discontinued During This Encounter  Medication Reason   Misc Natural Products (FIBER 7 PO)    Cholecalciferol 50 MCG (2000 UT) CAPS    rosuvastatin (CRESTOR) 10 MG tablet Reorder   rosuvastatin (CRESTOR) 10 MG tablet    dorzolamide-timolol (COSOPT) 22.3-6.8 MG/ML ophthalmic solution Patient Preference     Fred Thompson  is a 61 y.o.  male  with paroxysmal atrial fibrillation, paroxysmal atrial flutter who initially presented with syncope on 01/01/2016.  He was unable to tolerate flecainide, Multac, due to marked fatigue and also efficacy, underwent atrial fibrillation ablation on 09/29/2016.  He is essentially asymptomatic. He has been compliant with CPAP 100%.  Due to low CHA2DS2-VASc score, he is not on anticoagulants.  Mild nonischemic cardiomyopathy probably related to A-fib, echocardiogram in March 2022 unchanged from prior echocardiogram in 2018 with mild decrease in LVEF.  Physical examination is unremarkable and with no clinical evidence heart failure.  I reviewed his external labs, he does have hyperlipidemia and due to elevated coronary calcium score, I have recommended increasing the dose of statins from 10 mg to 20 mg, it appears that the medication dose was increased but patient never increase the medication dose.  I will confirm this however I have sent 20 mg Rx.  We will recheck lipids     Adrian Prows, MD, [facility] 05/10/2021, 9:30 PM Office: [phone] Pager: [phone]

## 2021-05-10 ENCOUNTER — Encounter: Payer: Self-pay | Admitting: Cardiology

## 2021-05-10 MED ORDER — ROSUVASTATIN CALCIUM 20 MG PO TABS: 20.0000 mg | ORAL_TABLET | Freq: Every day | ORAL | 3 refills | Status: DC

## 2021-05-13 DIAGNOSIS — R972 Elevated prostate specific antigen [PSA]: Secondary | ICD-10-CM | POA: Diagnosis not present

## 2021-05-13 DIAGNOSIS — R3912 Poor urinary stream: Secondary | ICD-10-CM | POA: Diagnosis not present

## 2021-05-13 DIAGNOSIS — N401 Enlarged prostate with lower urinary tract symptoms: Secondary | ICD-10-CM | POA: Diagnosis not present

## 2021-05-16 NOTE — Telephone Encounter (Signed)
From patient.

## 2021-05-20 DIAGNOSIS — M1712 Unilateral primary osteoarthritis, left knee: Secondary | ICD-10-CM | POA: Diagnosis not present

## 2021-05-20 DIAGNOSIS — M170 Bilateral primary osteoarthritis of knee: Secondary | ICD-10-CM | POA: Diagnosis not present

## 2021-05-20 DIAGNOSIS — M1711 Unilateral primary osteoarthritis, right knee: Secondary | ICD-10-CM | POA: Diagnosis not present

## 2021-05-25 NOTE — Telephone Encounter (Signed)
Pt has not received a call schedule Home Sleep Study ?

## 2021-06-01 ENCOUNTER — Telehealth: Payer: Self-pay

## 2021-06-01 NOTE — Telephone Encounter (Signed)
LVM for pt to call me back to schedule sleep study  

## 2021-06-08 DIAGNOSIS — G4733 Obstructive sleep apnea (adult) (pediatric): Secondary | ICD-10-CM | POA: Diagnosis not present

## 2021-06-13 ENCOUNTER — Ambulatory Visit (INDEPENDENT_AMBULATORY_CARE_PROVIDER_SITE_OTHER): Payer: BC Managed Care – PPO | Admitting: Neurology

## 2021-06-13 DIAGNOSIS — R55 Syncope and collapse: Secondary | ICD-10-CM

## 2021-06-13 DIAGNOSIS — I4892 Unspecified atrial flutter: Secondary | ICD-10-CM

## 2021-06-13 DIAGNOSIS — G4733 Obstructive sleep apnea (adult) (pediatric): Secondary | ICD-10-CM

## 2021-06-15 NOTE — Progress Notes (Signed)
? ? ?  ?  ?[facility] ?  ?HOME SLEEP TEST REPORT ( by Watch PAT)   ?STUDY DATE:  06-14-2021 ?  ?ORDERING CLINICIAN: Ward Givens, NP  ?REFERRING CLINICIAN: Dr Einar Gip, MD  ?  ?CLINICAL INFORMATION/HISTORY: Virtual Visit via Video Note ?03/09/21: Mr. Leibman is a 61 year old male with a history of obstructive sleep apnea on CPAP.  He returns today for follow-up.  He reports that the CPAP is working well for him however he does not like using it.  He states that all the cords and mask are aggravating but he does acknowledge that he sleeps better with the machine.  He is interested in inspire device. ?AHI is 1.4/h on 100% complaint with CPAP use at 12 cm water , 3 cm EPR.  ?   ?FINDINGS: ?  ?Sleep Summary: ?  ?Total Recording Time (hours, min): This home sleep test total recording time amounted to 8 hours and 27 minutes of which the total sleep time was calculated at 7 hours 35 minutes.  The patient had 25.6% REM sleep.                                     ?  ?Respiratory Indices: ?  ?Calculated pAHI (per hour): Calculated AHI was 16.1/h and lower in rem sleep than in non-REM sleep.  REM sleep AHI was 11.1/h in non-REM sleep AHI 17.9/h which is an unusual distribution for obstructive sleep apnea.                          ?                 ?Positional AHI: This patient's apnea is strongly dependent on supine position he slept 115 minutes in supine with an AHI of 43.2/h the AHI in prone sleep was 1.1/h on the right side 7 on the left side 7.3/h. ? ?Snoring statistics mean snoring volume was 40 dB and only accompanied 7.4% of total sleep time.  ? This would be mild snoring.                                               ?  ?Oxygen Saturation Statistics: ?   ?O2 Saturation Range (%):   Between a nadir at 87% and a maximum of 100% with a mean oxygenation at 95%.                                  ?  ?O2 Saturation (minutes) <89%: 0.1-minute       ?  ?Pulse Rate Statistics: ?  ?Pulse Mean (bpm):   66 bpm            ?   ?Pulse Range:     Between 37 and 113 bpm.  Please note that this home sleep test can only give heart rate but not information about the cardiac rhythm.          ?  ?IMPRESSION:  This HST confirms the presence of mild to moderate overall sleep apnea which is strongly dependent on sleep position.    ? ?RECOMMENDATION: ?If the patient avoids sleeping on his back his apnea would  already  be considered mild.  Depending on the current body mass index this patient could also be considering an inspire device (BMI must be 32 or below) .   ? ?There is no REM sleep dependence noted and there is no hypoxia of significance seen.   ?The home sleep test did not indicate central apnea to be present. ? ?  ?INTERPRETING PHYSICIAN: ?Larey Seat, MD  ?Medical Director of [employer] at [employer].  ? ? ? ? ? ? ? ? ? ? ? ? ? ? ? ? ?

## 2021-06-21 NOTE — Progress Notes (Signed)
HOME SLEEP TEST REPORT ( by Watch PAT) ? ?STUDY DATE: ?06-14-2021 ?? ?ORDERING CLINICIAN: Butch Penny, NP  ?REFERRING CLINICIAN:?Dr Jacinto Halim, MD  ?? ?IMPRESSION:  This HST confirms the presence of mild to moderate overall sleep apnea which is strongly dependent on sleep position.  ? ? ?RECOMMENDATION: ?If the patient avoids sleeping on his back his apnea would already be considered mild.  CPAP is one treatment option, but ?depending on the current body mass index this patient could also be considering an inspire device (BMI must be 32 or below).   ? ?There is no REM sleep dependence noted and there is no hypoxia of significance seen.  These conditions would exclude Inspire use.  ?The home sleep test did not indicate central apnea to be present. ? ?? ?INTERPRETING PHYSICIAN: ?Melvyn Novas, MD  ?Medical Director of [employer] at [employer].  ? ? ? ? ? ? ? ? ? ? ?

## 2021-06-21 NOTE — Procedures (Signed)
? ? ?  ?  ?[facility] ?  ?HOME SLEEP TEST REPORT ( by Watch PAT)   ?STUDY DATE:  06-14-2021 ?  ?ORDERING CLINICIAN: Megan Millikan, NP  ?REFERRING CLINICIAN: Dr Ganji, MD  ?  ?CLINICAL INFORMATION/HISTORY: Virtual Visit via Video Note ?03/09/21: Fred Thompson is a 60-year-old male with a history of obstructive sleep apnea on CPAP.  He returns today for follow-up.  He reports that the CPAP is working well for him however he does not like using it.  He states that all the cords and mask are aggravating but he does acknowledge that he sleeps better with the machine.  He is interested in inspire device. ?AHI is 1.4/h on 100% complaint with CPAP use at 12 cm water , 3 cm EPR.  ?   ?FINDINGS: ?  ?Sleep Summary: ?  ?Total Recording Time (hours, min): This home sleep test total recording time amounted to 8 hours and 27 minutes of which the total sleep time was calculated at 7 hours 35 minutes.  The patient had 25.6% REM sleep.                                     ?  ?Respiratory Indices: ?  ?Calculated pAHI (per hour): Calculated AHI was 16.1/h and lower in rem sleep than in non-REM sleep.  REM sleep AHI was 11.1/h in non-REM sleep AHI 17.9/h which is an unusual distribution for obstructive sleep apnea.                          ?                 ?Positional AHI: This patient's apnea is strongly dependent on supine position he slept 115 minutes in supine with an AHI of 43.2/h the AHI in prone sleep was 1.1/h on the right side 7 on the left side 7.3/h. ? ?Snoring statistics mean snoring volume was 40 dB and only accompanied 7.4% of total sleep time.  ? This would be mild snoring.                                               ?  ?Oxygen Saturation Statistics: ?   ?O2 Saturation Range (%):   Between a nadir at 87% and a maximum of 100% with a mean oxygenation at 95%.                                  ?  ?O2 Saturation (minutes) <89%: 0.1-minute       ?  ?Pulse Rate Statistics: ?  ?Pulse Mean (bpm):   66 bpm            ?   ?Pulse Range:     Between 37 and 113 bpm.  Please note that this home sleep test can only give heart rate but not information about the cardiac rhythm.          ?  ?IMPRESSION:  This HST confirms the presence of mild to moderate overall sleep apnea which is strongly dependent on sleep position.    ? ?RECOMMENDATION: ?If the patient avoids sleeping on his back his apnea would  already   be considered mild.  Depending on the current body mass index this patient could also be considering an inspire device (BMI must be 32 or below) .   ? ?There is no REM sleep dependence noted and there is no hypoxia of significance seen.   ?The home sleep test did not indicate central apnea to be present. ? ?  ?INTERPRETING PHYSICIAN: ?Neta Upadhyay, MD  ?Medical Director of [facility] at [facility].  ? ? ? ? ? ? ? ? ? ? ? ? ? ? ? ? ?

## 2021-06-22 ENCOUNTER — Other Ambulatory Visit: Payer: Self-pay | Admitting: *Deleted

## 2021-06-22 ENCOUNTER — Telehealth: Payer: Self-pay | Admitting: Neurology

## 2021-06-22 DIAGNOSIS — Z9989 Dependence on other enabling machines and devices: Secondary | ICD-10-CM

## 2021-06-22 NOTE — Telephone Encounter (Signed)
I sent pt mychart message this am prior to [facility] calling and LVM.  ? ?"Hi Mr. Dunson,  Dr. Vickey Huger reviewed your most recent sleep study. She said that it did show you had mild to moderate sleep apnea. It was dependent on your sleep position. She would recommend having you avoid sleeping on your back as your apnea was worse in the position. There was no significant drop in your oxygen levels during the study which was good. It also did not show any indication of central apnea. At this point, it looks like you would qualify at this point for the inspire device since your last recorded BMI was 32 or below being at 26.68. We will go ahead and place a referral to ENT provider that can see you for this. Please be on the look out for a phone call in the next couple weeks to get this scheduled. Please let us know if you have any more questions or concerns. Thank you, Mackay Hanauer,RN" ? ? ?Pt states he got Casey's message and he preferred to call back to go over results rather than going on mychart. I relayed above message. Pt agreeable to plan. Aware referral placed to ENT and someone should reach out in the next 1-2 weeks to schedule him an appointment. He verbalized understanding and appreciation. ?

## 2021-06-22 NOTE — Telephone Encounter (Signed)
Called the pt back to review the results. There was no answer. LVM advising a message had been sent through mychart explaining results but advised if he had questions he can call us back.  ?

## 2021-06-22 NOTE — Telephone Encounter (Signed)
-----   Message from Larey Seat, MD sent at 06/21/2021  5:58 PM EDT ----- ?HOME SLEEP TEST REPORT ( by Watch PAT) ? ?STUDY DATE: ?06-14-2021 ?? ?ORDERING CLINICIAN: Ward Givens, NP  ?REFERRING CLINICIAN:?Dr Einar Gip, MD  ?? ?IMPRESSION:  This HST confirms the presence of mild to moderate overall sleep apnea which is strongly dependent on sleep position.  ? ? ?RECOMMENDATION: ?If the patient avoids sleeping on his back his apnea would already be considered mild.  CPAP is one treatment option, but ?depending on the current body mass index this patient could also be considering an inspire device (BMI must be 32 or below).   ? ?There is no REM sleep dependence noted and there is no hypoxia of significance seen.  These conditions would exclude Inspire use.  ?The home sleep test did not indicate central apnea to be present. ? ?? ?INTERPRETING PHYSICIAN: ?Larey Seat, MD  ?Medical Director of [employer] at [employer].  ? ? ?  ? ? ?  ? ? ?  ? ?  ?

## 2021-06-23 ENCOUNTER — Telehealth: Payer: Self-pay | Admitting: Neurology

## 2021-06-23 NOTE — Telephone Encounter (Signed)
Referral sent to Dr. Jenne Pane & Dr. Clovis Pu office [phone]. ?

## 2021-07-08 DIAGNOSIS — E7800 Pure hypercholesterolemia, unspecified: Secondary | ICD-10-CM | POA: Diagnosis not present

## 2021-07-09 DIAGNOSIS — G4733 Obstructive sleep apnea (adult) (pediatric): Secondary | ICD-10-CM | POA: Diagnosis not present

## 2021-07-09 LAB — LIPID PANEL WITH LDL/HDL RATIO
Cholesterol, Total: 140 mg/dL (ref 100–199)
HDL: 45 mg/dL (ref >39)
LDL Chol Calc (NIH): 83 mg/dL (ref 0–99)
LDL/HDL Ratio: 1.8 ratio (ref 0.0–3.6)
Triglycerides: 57 mg/dL (ref 0–149)
VLDL Cholesterol Cal: 12 mg/dL (ref 5–40)

## 2021-08-01 DIAGNOSIS — Z961 Presence of intraocular lens: Secondary | ICD-10-CM | POA: Diagnosis not present

## 2021-08-01 DIAGNOSIS — Z9841 Cataract extraction status, right eye: Secondary | ICD-10-CM | POA: Diagnosis not present

## 2021-08-01 DIAGNOSIS — Z9889 Other specified postprocedural states: Secondary | ICD-10-CM | POA: Diagnosis not present

## 2021-08-01 DIAGNOSIS — H25812 Combined forms of age-related cataract, left eye: Secondary | ICD-10-CM | POA: Diagnosis not present

## 2021-08-08 ENCOUNTER — Encounter: Payer: Self-pay | Admitting: Cardiology

## 2021-08-08 ENCOUNTER — Ambulatory Visit: Payer: BC Managed Care – PPO | Admitting: Cardiology

## 2021-08-08 VITALS — BP 121/76 | HR 68 | Temp 98.5°F | Resp 16 | Ht 77.0 in | Wt 219.8 lb

## 2021-08-08 DIAGNOSIS — E7800 Pure hypercholesterolemia, unspecified: Secondary | ICD-10-CM | POA: Diagnosis not present

## 2021-08-08 DIAGNOSIS — R002 Palpitations: Secondary | ICD-10-CM | POA: Diagnosis not present

## 2021-08-08 DIAGNOSIS — R0609 Other forms of dyspnea: Secondary | ICD-10-CM | POA: Diagnosis not present

## 2021-08-08 DIAGNOSIS — I480 Paroxysmal atrial fibrillation: Secondary | ICD-10-CM | POA: Diagnosis not present

## 2021-08-08 DIAGNOSIS — I428 Other cardiomyopathies: Secondary | ICD-10-CM

## 2021-08-08 DIAGNOSIS — G4733 Obstructive sleep apnea (adult) (pediatric): Secondary | ICD-10-CM | POA: Diagnosis not present

## 2021-08-08 MED ORDER — ROSUVASTATIN CALCIUM 40 MG PO TABS: 40.0000 mg | ORAL_TABLET | Freq: Every day | ORAL | 3 refills | Status: DC

## 2021-08-08 NOTE — Progress Notes (Signed)
Subjective:  Primary Physician:  Lujean Amel, MD  Patient ID: Fred Thompson, male    DOB: October 21, 1960, 61 y.o.   MRN: [id number]  Chief Complaint  Patient presents with   Atrial Fibrillation    HPI: Fred Thompson  is a 61 y.o. male  male  with paroxysmal atrial fibrillation, paroxysmal atrial flutter who initially presented with syncope on 01/01/2016.  He was unable to tolerate flecainide, Multac, due to marked fatigue and also efficacy, underwent atrial fibrillation ablation on 09/29/2016. He has been compliant with CPAP 100%.  Due to low CHA2DS2-VASc score, he is not on anticoagulants.  I had seen him in February 2023, he was essentially asymptomatic.  He made an appointment, he had a fall 2 weeks ago and fell on the left side of his shoulder, since then he has noticed marked dyspnea on exertion, dizziness and also episodes of palpitations, he thinks that he is back in atrial fibrillation.  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 on diltiazem   Hemorrhoids 01/20/2016   LV dysfunction    a. echo 12/2015: EF 45-50%, mild global HK, normal diastolic function, mildly dilated LA, trace MR, trace TR, unable to estimate PASP   PAF (paroxysmal atrial fibrillation) ([place])    a. diagnosed in 12/2015; b. failed flecainide and multaq; c. CHADS2VASc => 1 (CHF); d. on xarelto; e. s/p successful Afib ablation 09/29/2016   Paroxysmal atrial flutter ([place]) 01/02/2016   PVC (premature ventricular contraction)    Sleep apnea     Past Surgical History:  Procedure Laterality Date   APPENDECTOMY  1973   ATRIAL FIBRILLATION ABLATION N/A 09/29/2016   Procedure: Atrial Fibrillation Ablation;  Surgeon: Constance Haw, MD;  Location: [facility];  Service: Cardiovascular;  Laterality: N/A;   CATARACT EXTRACTION  Right 09/2017   has had infection, then oil bubble, the gas bubble 02/06/2018   KNEE SURGERY Right 1999   arthroscopic   NM RENAL LASIX ([place] [facility]) Bilateral 2007   TRANSANAL HEMORRHOIDAL DEARTERIALIZATION N/A 07/08/2019   Procedure: TRANSANAL HEMORRHOIDAL DEARTERIALIZATION;  Surgeon: Leighton Ruff, MD;  Location: [facility];  Service: General;  Laterality: N/A;   Social History   Tobacco Use   Smoking status: Never   Smokeless tobacco: Never  Substance Use Topics   Alcohol use: Yes    Comment: occasional  Married    Current Outpatient Medications:    brimonidine (ALPHAGAN) 0.2 % ophthalmic solution, Place 1 drop into the right eye in the morning and at bedtime. , Disp: , Rfl:    cholecalciferol (VITAMIN D3) 25 MCG (1000 UNIT) tablet, Take 1,000 Units by mouth daily., Disp: , Rfl:    diltiazem (TIADYLT ER) 120 MG 24 hr capsule, Take 1 capsule (120 mg total) by mouth daily., Disp: 90 capsule, Rfl: 3   dorzolamide-timolol (COSOPT) 22.3-6.8 MG/ML ophthalmic solution, Place 2 drops into the right eye daily., Disp: , Rfl:    finasteride (PROSCAR) 5 MG tablet, Take 5 mg by mouth daily., Disp: , Rfl:    meloxicam (MOBIC) 15 MG tablet, Take 15 mg by mouth as needed., Disp: , Rfl:    vitamin E 400 UNIT capsule, Take 400 Units by mouth daily., Disp: , Rfl:    rosuvastatin (CRESTOR) 40 MG tablet, Take 1 tablet (40  mg total) by mouth daily., Disp: 90 tablet, Rfl: 3   Review of Systems  Cardiovascular:  Positive for dyspnea on exertion and palpitations. Negative for chest pain and leg swelling.  Neurological:  Positive for dizziness.   Objective:  Blood pressure 121/76, pulse 68, temperature 98.5 F (36.9 C), temperature source Temporal, resp. rate 16, height _0  (1.956 m), weight 219 lb 12.8 oz (99.7 kg), SpO2 98 %. Body mass index is 26.06 kg/m.    08/08/2021    8:44 AM 05/09/2021    3:33 PM 05/07/2020    1:19 PM  Vitals with BMI  Height _1  _2  _3   Weight 219 lbs  13 oz 225 lbs 223 lbs  BMI 26.06 42.35 36.14  Systolic 431 540 086  Diastolic 76 80 82  Pulse 68 73 68   Physical Exam Neck:     Vascular: No JVD.  Cardiovascular:     Rate and Rhythm: Normal rate and regular rhythm.     Pulses: Intact distal pulses.     Heart sounds: Normal heart sounds. No murmur heard.   No gallop.  Pulmonary:     Effort: Pulmonary effort is normal.     Breath sounds: Normal breath sounds.  Abdominal:     General: Bowel sounds are normal.     Palpations: Abdomen is soft.  Musculoskeletal:     Right lower leg: No edema.     Left lower leg: No edema.   Recent Results (from the past 2160 hour(s))  Lipid Panel With LDL/HDL Ratio     Status: None   Collection Time: 07/08/21  8:38 AM  Result Value Ref Range   Cholesterol, Total 140 100 - 199 mg/dL   Triglycerides 57 0 - 149 mg/dL   HDL 45 >39 mg/dL   VLDL Cholesterol Cal 12 5 - 40 mg/dL   LDL Chol Calc (NIH) 83 0 - 99 mg/dL   LDL/HDL Ratio 1.8 0.0 - 3.6 ratio    Comment:                                     LDL/HDL Ratio                                             Men  Women                               1/2 Avg.Risk  1.0    1.5                                   Avg.Risk  3.6    3.2                                2X Avg.Risk  6.2    5.0                                3X Avg.Risk  8.0    6.1     External labs:  Labs 12/07/2020:  Hb 14.6/HCT 43.2, platelets 191,  normal indicis.  Serum glucose 95 mg, BUN 15, creatinine 0.96, EGFR 89 mL, potassium 4.2, LFTs normal.  TSH normal.  Total cholesterol 166, triglycerides 103, HDL 44, LDL 103.  Non-HDL cholesterol 03/20/2020.  Labs 01/02/2020:  Serum glucose 98 mg, BUN 13, creatinine 0.98, EGFR 78 mL, potassium 4.6.  CMP otherwise normal.  Total cholesterol 162, triglycerides 102, HDL 40, LDL 103.  Non-HDL cholesterol 122.  CARDIAC STUDIES:   Exercise sestamibi stress test 07/14/2016: 1. Resting EKG demonstrates atypical atrial flutter with variable  ventricular response.  Stress EKG was negative for myocardial ischemia.  Patient appeared to have developed atrial fibrillation with peak exercise which reverted to atypical atrial flutter versus coarse atrial fibrillation in recovery.  Rare PVC.  Nonspecific ST segment changes noted at both rest and stress.  Patient exercised for 7:30 minutes and achieved 10.35 METs.  Normal blood pressure response. 2. Left ventricular cavity is noted to be enlarged on the rest and stress studies and measured 142 mL.  SPECT images demonstrate homogeneous tracer distribution throughout the myocardium.  Gated SPECT imaging demonstrates global hypokinesis. The left ventricular ejection fraction was calculated or visually estimated to be 32%.  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 This is an intermediate risk study, clinical correlation recommended.  Coronary calcium score with CT FFR 09/22/2016: Calcium Score:  372 Agatston units. Coronary Arteries: Mild heterogeneous plaque in the coronary arteries.  Right dominant with no anomalies LM:  No significant coronary disease. LAD system: There was mixed plaque in the proximal LAD with mild stenosis. Small D1, moderate D2, small D3 without significant disease. Circumflex system: There was a large ramus present with calcified plaque and mild stenosis present in the proximal vessel. The AV LCx was a relatively small vessel. There was calcified plaque at the ostium without significant stenosis. There was calcified plaque in the mid to distal vessel, probably with mild stenosis (LCx was small in caliber) RCA: Mixed plaque in the mid to distal RCA without significant stenosis.  PCV ECHOCARDIOGRAM COMPLETE 06/16/2020  Mildly depressed LV systolic function with visual EF 45-50%. Left ventricle cavity is normal in size. Normal global wall motion. Doppler evidence of grade I (impaired) diastolic dysfunction, normal LAP. Left atrial cavity is moderately dilated. IVC is  dilated with a respiratory response of <50%, suggest elevated right heart pressure. Compared to prior study dated 05/16/2018: Moderate LAE is new otherwise no significant change.   EKG:  EKG 08/08/2021: Normal sinus rhythm at rate of 65 bpm, normal axis, no evidence of ischemia, normal EKG.  No significant change from 05/17/2021, frequent PVCs not present.  Assessment & Recommendations:      ICD-10-CM   1. Dyspnea on exertion  R06.09 PCV ECHOCARDIOGRAM COMPLETE    2. Palpitations  R00.2 LONG TERM MONITOR (3-14 DAYS)    3. Paroxysmal atrial fibrillation (HCC)  I48.0 EKG 12-Lead    LONG TERM MONITOR (3-14 DAYS)    PCV ECHOCARDIOGRAM COMPLETE    4. Non-ischemic cardiomyopathy (HCC)  I42.8 PCV ECHOCARDIOGRAM COMPLETE    5. Hypercholesteremia  E78.00 rosuvastatin (CRESTOR) 40 MG tablet     Medications Discontinued During This Encounter  Medication Reason   rosuvastatin (CRESTOR) 20 MG tablet Reorder      Fred Thompson  is a 61 y.o.  male  with paroxysmal atrial fibrillation, paroxysmal atrial flutter who initially presented with syncope on 01/01/2016.  He was unable to tolerate flecainide, Multac, due to marked fatigue and also efficacy, underwent atrial fibrillation ablation on 09/29/2016. He  has been compliant with CPAP 100%.  Due to low CHA2DS2-VASc score, he is not on anticoagulants.  I had seen him in February 2023, he was essentially asymptomatic.  He made an appointment, he had a fall 2 weeks ago and fell on the left side of his shoulder, since then he has noticed marked dyspnea on exertion, dizziness and also episodes of palpitations, he thinks that he is back in atrial fibrillation.  On his last office visit he had frequent PVCs on his EKG which I do not see today and he is in essentially sinus rhythm.  Physical examination is unchanged.  I will perform extended EKG monitoring for 2 weeks, repeat echocardiogram.  I do not suspect progression of coronary artery disease as he has  not had any chest pain, he is a non-smoker and nondiabetic.  He does have elevated coronary calcium score and no significant coronary disease by coronary CTA in 2018.  However in view of elevated coronary calcium score his goal LDL <70, will increase his Crestor to 40 mg daily.  On his next office visit I will place orders for recheck of his lipids, or like to see him back in 4 weeks.      Adrian Prows, MD, [facility] 08/08/2021, 7:32 PM Office: [phone] Pager: [phone]

## 2021-08-29 ENCOUNTER — Inpatient Hospital Stay: Payer: BC Managed Care – PPO

## 2021-08-30 ENCOUNTER — Inpatient Hospital Stay: Payer: BC Managed Care – PPO

## 2021-09-01 ENCOUNTER — Other Ambulatory Visit: Payer: BC Managed Care – PPO

## 2021-09-01 DIAGNOSIS — H25812 Combined forms of age-related cataract, left eye: Secondary | ICD-10-CM | POA: Diagnosis not present

## 2021-09-01 DIAGNOSIS — Z9889 Other specified postprocedural states: Secondary | ICD-10-CM | POA: Diagnosis not present

## 2021-09-05 ENCOUNTER — Other Ambulatory Visit: Payer: BC Managed Care – PPO

## 2021-09-12 ENCOUNTER — Ambulatory Visit: Payer: BC Managed Care – PPO

## 2021-09-12 DIAGNOSIS — I428 Other cardiomyopathies: Secondary | ICD-10-CM

## 2021-09-12 DIAGNOSIS — R0609 Other forms of dyspnea: Secondary | ICD-10-CM | POA: Diagnosis not present

## 2021-09-12 DIAGNOSIS — I480 Paroxysmal atrial fibrillation: Secondary | ICD-10-CM

## 2021-09-13 DIAGNOSIS — Z9889 Other specified postprocedural states: Secondary | ICD-10-CM | POA: Diagnosis not present

## 2021-09-13 DIAGNOSIS — G4733 Obstructive sleep apnea (adult) (pediatric): Secondary | ICD-10-CM | POA: Diagnosis not present

## 2021-09-13 DIAGNOSIS — H25812 Combined forms of age-related cataract, left eye: Secondary | ICD-10-CM | POA: Diagnosis not present

## 2021-09-15 ENCOUNTER — Ambulatory Visit: Payer: BC Managed Care – PPO | Admitting: Cardiology

## 2021-09-15 ENCOUNTER — Encounter: Payer: Self-pay | Admitting: Cardiology

## 2021-09-15 VITALS — BP 133/84 | HR 72 | Temp 98.5°F | Resp 16 | Ht 77.0 in | Wt 220.8 lb

## 2021-09-15 DIAGNOSIS — I480 Paroxysmal atrial fibrillation: Secondary | ICD-10-CM | POA: Diagnosis not present

## 2021-09-15 DIAGNOSIS — E7800 Pure hypercholesterolemia, unspecified: Secondary | ICD-10-CM | POA: Diagnosis not present

## 2021-09-15 DIAGNOSIS — R931 Abnormal findings on diagnostic imaging of heart and coronary circulation: Secondary | ICD-10-CM | POA: Diagnosis not present

## 2021-09-15 DIAGNOSIS — R002 Palpitations: Secondary | ICD-10-CM | POA: Diagnosis not present

## 2021-09-15 DIAGNOSIS — Z8679 Personal history of other diseases of the circulatory system: Secondary | ICD-10-CM

## 2021-09-15 MED ORDER — DILTIAZEM HCL ER BEADS 180 MG PO CP24: 180.0000 mg | ORAL_CAPSULE | Freq: Every day | ORAL | 3 refills | Status: DC

## 2021-09-15 NOTE — Progress Notes (Signed)
Subjective:  Primary Physician:  Lujean Amel, MD  Patient ID: Fred Thompson, male    DOB: 14-Apr-1960, 61 y.o.   MRN: [id number]  No chief complaint on file.   HPI: Fred Thompson  is a 61 y.o. male  with paroxysmal atrial fibrillation, paroxysmal atrial flutter who initially presented with syncope on 01/01/2016.  He was unable to tolerate flecainide, Multac, due to marked fatigue and also efficacy, underwent atrial fibrillation ablation on 09/29/2016. He has been compliant with CPAP 100%.  Due to low CHA2DS2-VASc score, he is not on anticoagulants.  He was seen by me about 4 to 6 weeks ago after he had a fall on his shoulder, since then he has noticed dyspnea on exertion, dizziness and also palpitations.  He has now recuperated completely, states that his symptoms of palpitations have essentially resolved and he has not having any further dyspnea. .  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 on diltiazem   Hemorrhoids 01/20/2016   LV dysfunction    a. echo 12/2015: EF 45-50%, mild global HK, normal diastolic function, mildly dilated LA, trace MR, trace TR, unable to estimate PASP   PAF (paroxysmal atrial fibrillation) ([place])    a. diagnosed in 12/2015; b. failed flecainide and multaq; c. CHADS2VASc => 1 (CHF); d. on xarelto; e. s/p successful Afib ablation 09/29/2016   Paroxysmal atrial flutter ([place]) 01/02/2016   PVC (premature ventricular contraction)    Sleep apnea     Past Surgical History:  Procedure Laterality Date   APPENDECTOMY  1973   ATRIAL FIBRILLATION ABLATION N/A 09/29/2016   Procedure: Atrial Fibrillation Ablation;  Surgeon: Constance Haw, MD;  Location: [facility];  Service: Cardiovascular;  Laterality: N/A;   CATARACT EXTRACTION Right 09/2017   has had infection, then oil  bubble, the gas bubble 02/06/2018   KNEE SURGERY Right 1999   arthroscopic   NM RENAL LASIX ([place] [facility]X) Bilateral 2007   TRANSANAL HEMORRHOIDAL DEARTERIALIZATION N/A 07/08/2019   Procedure: TRANSANAL HEMORRHOIDAL DEARTERIALIZATION;  Surgeon: Leighton Ruff, MD;  Location: [place];  Service: General;  Laterality: N/A;   Social History   Tobacco Use   Smoking status: Never   Smokeless tobacco: Never  Substance Use Topics   Alcohol use: Yes    Comment: occasional  Married    Current Outpatient Medications:    brimonidine (ALPHAGAN) 0.2 % ophthalmic solution, Place 1 drop into the right eye in the morning and at bedtime. , Disp: , Rfl:    Bromfenac Sodium (PROLENSA) 0.07 % SOLN, Place 1 drop into the left eye daily., Disp: , Rfl:    cholecalciferol (VITAMIN D3) 25 MCG (1000 UNIT) tablet, Take 1,000 Units by mouth daily., Disp: , Rfl:    dorzolamide-timolol (COSOPT) 22.3-6.8 MG/ML ophthalmic solution, Place 2 drops into the right eye daily., Disp: , Rfl:    finasteride (PROSCAR) 5 MG tablet, Take 5 mg by mouth daily., Disp: , Rfl:    meloxicam (MOBIC) 15 MG tablet, Take 15 mg by mouth as needed., Disp: , Rfl:    rosuvastatin (CRESTOR) 40 MG tablet, Take 1 tablet (40 mg total) by mouth daily., Disp: 90 tablet, Rfl: 3   vitamin E 400 UNIT capsule, Take 400 Units by mouth daily., Disp: , Rfl:    diltiazem (  TIADYLT ER) 180 MG 24 hr capsule, Take 1 capsule (180 mg total) by mouth daily., Disp: 90 capsule, Rfl: 3   Review of Systems  Cardiovascular:  Negative for chest pain, dyspnea on exertion and leg swelling.    Objective:  Blood pressure 133/84, pulse 72, temperature 98.5 F (36.9 C), temperature source Temporal, resp. rate 16, height $RemoveBe'6\' 5"'yNJQhzXTh$  (1.956 m), weight 220 lb 12.8 oz (100.2 kg), SpO2 98 %. Body mass index is 26.18 kg/m.    09/15/2021    2:25 PM 08/08/2021    8:44 AM 05/09/2021    3:33 PM  Vitals with BMI  Height $Remov'6\' 5"'hNvmwK$  $Remove'6\' 5"'nCUYuaV$  $RemoveB'6\' 5"'tlNTuQyN$   Weight 220 lbs 13 oz 219 lbs  13 oz 225 lbs  BMI 26.18 16.10 96.04  Systolic 540 981 191  Diastolic 84 76 80  Pulse 72 68 73   Physical Exam Neck:     Vascular: No JVD.  Cardiovascular:     Rate and Rhythm: Normal rate and regular rhythm.     Pulses: Intact distal pulses.     Heart sounds: Normal heart sounds. No murmur heard.    No gallop.  Pulmonary:     Effort: Pulmonary effort is normal.     Breath sounds: Normal breath sounds.  Abdominal:     General: Bowel sounds are normal.     Palpations: Abdomen is soft.  Musculoskeletal:     Right lower leg: No edema.     Left lower leg: No edema.    External labs:  Labs 12/07/2020:  Hb 14.6/HCT 43.2, platelets 191, normal indicis.  Serum glucose 95 mg, BUN 15, creatinine 0.96, EGFR 89 mL, potassium 4.2, LFTs normal.  TSH normal.  Total cholesterol 166, triglycerides 103, HDL 44, LDL 103.  Non-HDL cholesterol 03/20/2020.  Labs 01/02/2020:  Serum glucose 98 mg, BUN 13, creatinine 0.98, EGFR 78 mL, potassium 4.6.  CMP otherwise normal.  Total cholesterol 162, triglycerides 102, HDL 40, LDL 103.  Non-HDL cholesterol 122.  CARDIAC STUDIES:   Exercise sestamibi stress test 07/14/2016: 1. Resting EKG demonstrates atypical atrial flutter with variable ventricular response.  Stress EKG was negative for myocardial ischemia.  Patient appeared to have developed atrial fibrillation with peak exercise which reverted to atypical atrial flutter versus coarse atrial fibrillation in recovery.  Rare PVC.  Nonspecific ST segment changes noted at both rest and stress.  Patient exercised for 7:30 minutes and achieved 10.35 METs.  Normal blood pressure response. 2. Left ventricular cavity is noted to be enlarged on the rest and stress studies and measured 142 mL.  SPECT images demonstrate homogeneous tracer distribution throughout the myocardium.  Gated SPECT imaging demonstrates global hypokinesis. The left ventricular ejection fraction was calculated or visually estimated to be  32%.  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 This is an intermediate risk study, clinical correlation recommended.  Coronary calcium score with CT FFR 09/22/2016: Coronary Calcium Score:  372 Agatston units. 89th percentile in [place]. Coronary Arteries: Mild heterogeneous plaque in the coronary arteries.  Right dominant with no anomalies LM:  No significant coronary disease. LAD system: There was mixed plaque in the proximal LAD with mild stenosis. Small D1, moderate D2, small D3 without significant disease. Circumflex system: There was a large ramus present with calcified plaque and mild stenosis present in the proximal vessel. The AV LCx was a relatively small vessel. There was calcified plaque at the ostium without significant stenosis. There was calcified plaque in the mid to distal vessel, probably  with mild stenosis (LCx was small in caliber) RCA: Mixed plaque in the mid to distal RCA without significant stenosis.  PCV ECHOCARDIOGRAM COMPLETE 09/12/2021  Left ventricle cavity is normal in size and wall thickness. Normal global wall motion. Normal LV systolic function with EF 60%. Normal diastolic filling pattern. Left atrial cavity is borderline dilated. No significant valvular abnormality. Previous study on 06/16/2020, LVEF improved from 45-50%, reported moderate LA dilatation, grade 1 diastolic dysfunction.   [facility] Extended out patient EKG monitoring 14 days starting 08/08/2021:  Predominant rhythm is normal sinus rhythm.  Minimum heart rate 54, maximum heart rate 169 bpm with average heart rate of 74 bpm. There were 3 NSVT episodes longest 5 beats, EKG evaluation = atrial couplets and atrial ectopy, occasional ventricular couplets.  There were 11 SVT episodes essentially brief atrial tachycardia, longest 18 seconds with average heart rate of 130 bpm.  Otherwise no atrial fibrillation.  Occasional PACs and PVCs and ventricular bigeminy and trigeminy were noted. There  were no patient activated events.  EKG:  EKG 08/08/2021: Normal sinus rhythm at rate of 65 bpm, normal axis, no evidence of ischemia, normal EKG.  No significant change from 05/17/2021, frequent PVCs not present.  Assessment & Recommendations:      ICD-10-CM   1. Palpitations  R00.2 diltiazem (TIADYLT ER) 180 MG 24 hr capsule    2. Coronary Calcium Score: 372 Agatston units. 89th percentile [employer] data base 09/22/2016  R93.1     3. Hypercholesteremia  E78.00     4. H/O atrial fibrillation without current medication  Z86.79 diltiazem (TIADYLT ER) 180 MG 24 hr capsule     Medications Discontinued During This Encounter  Medication Reason   diltiazem (TIADYLT ER) 120 MG 24 hr capsule Reorder     Fred Thompson  is a 61 y.o.  male  with paroxysmal atrial fibrillation, paroxysmal atrial flutter who initially presented with syncope on 01/01/2016.  He was unable to tolerate flecainide, Multac, due to marked fatigue and also efficacy, underwent atrial fibrillation ablation on 09/29/2016. He has been compliant with CPAP 100%.  Due to low CHA2DS2-VASc score, he is not on anticoagulants.  He was seen by me about 4 to 6 weeks ago after he had a fall on his shoulder, since then he has noticed dyspnea on exertion, dizziness and also palpitations.  He has now recuperated completely, states that his symptoms of palpitations have essentially resolved and he has not having any further dyspnea.  He has had left eye cataract surgery 2 days ago and has not been using CPAP until that heals but otherwise is 100% compliant.  I reviewed the results of the echocardiogram, fortunately since he is maintaining sinus rhythm, LVEF has improved to normal.  Also reviewed the extended EKG monitoring which reveals only occasional PACs and brief atrial tachycardia.  Both for hypertension and for PACs, to reduce the risk of recurrence of A-fib, I have increased the dose of the diltiazem from 120 mg to 180 mg daily.  In view  of elevated coronary calcium score, I did increase the dose of the Crestor from 20 mg to 40 mg which he is tolerating.  Continue the same, he has complete physical with his PCP in the fall, LDL goal <70 if not closer to 55 as he is only 61 years of age.  All these were also explained to his wife today.  I will see him back on an annual basis.      Adrian Prows,  MD, [facility] 09/15/2021, 3:34 PM Office: [phone] Pager: [phone]

## 2021-09-26 DIAGNOSIS — Z9989 Dependence on other enabling machines and devices: Secondary | ICD-10-CM | POA: Diagnosis not present

## 2021-09-26 DIAGNOSIS — G4733 Obstructive sleep apnea (adult) (pediatric): Secondary | ICD-10-CM | POA: Diagnosis not present

## 2021-09-26 DIAGNOSIS — J342 Deviated nasal septum: Secondary | ICD-10-CM | POA: Diagnosis not present

## 2021-09-27 ENCOUNTER — Other Ambulatory Visit: Payer: Self-pay | Admitting: Otolaryngology

## 2021-10-18 ENCOUNTER — Encounter (HOSPITAL_BASED_OUTPATIENT_CLINIC_OR_DEPARTMENT_OTHER): Payer: Self-pay | Admitting: Otolaryngology

## 2021-10-18 ENCOUNTER — Other Ambulatory Visit: Payer: Self-pay

## 2021-10-25 ENCOUNTER — Other Ambulatory Visit: Payer: Self-pay

## 2021-10-25 ENCOUNTER — Ambulatory Visit (HOSPITAL_BASED_OUTPATIENT_CLINIC_OR_DEPARTMENT_OTHER)
Admission: RE | Admit: 2021-10-25 | Discharge: 2021-10-25 | Disposition: A | Discharge status: Home / Self Care | Payer: BC Managed Care – PPO | Attending: Otolaryngology | Admitting: Otolaryngology

## 2021-10-25 ENCOUNTER — Encounter (HOSPITAL_BASED_OUTPATIENT_CLINIC_OR_DEPARTMENT_OTHER)
Admission: RE | Disposition: A | Discharge status: Home / Self Care | Payer: Self-pay | Source: Home / Self Care | Attending: Otolaryngology

## 2021-10-25 ENCOUNTER — Ambulatory Visit (HOSPITAL_BASED_OUTPATIENT_CLINIC_OR_DEPARTMENT_OTHER): Payer: BC Managed Care – PPO | Admitting: Anesthesiology

## 2021-10-25 ENCOUNTER — Encounter (HOSPITAL_BASED_OUTPATIENT_CLINIC_OR_DEPARTMENT_OTHER): Payer: Self-pay | Admitting: Otolaryngology

## 2021-10-25 DIAGNOSIS — G4733 Obstructive sleep apnea (adult) (pediatric): Secondary | ICD-10-CM | POA: Diagnosis not present

## 2021-10-25 DIAGNOSIS — Z01818 Encounter for other preprocedural examination: Secondary | ICD-10-CM

## 2021-10-25 DIAGNOSIS — Z9989 Dependence on other enabling machines and devices: Secondary | ICD-10-CM | POA: Diagnosis not present

## 2021-10-25 DIAGNOSIS — I4891 Unspecified atrial fibrillation: Secondary | ICD-10-CM | POA: Diagnosis not present

## 2021-10-25 DIAGNOSIS — I509 Heart failure, unspecified: Secondary | ICD-10-CM | POA: Diagnosis not present

## 2021-10-25 HISTORY — PX: DRUG INDUCED ENDOSCOPY: SHX6808

## 2021-10-25 HISTORY — DX: Unspecified osteoarthritis, unspecified site: M19.90

## 2021-10-25 SURGERY — DRUG INDUCED SLEEP ENDOSCOPY
Anesthesia: Monitor Anesthesia Care | Site: Nose

## 2021-10-25 MED ORDER — LACTATED RINGERS IV SOLN: INTRAVENOUS | Status: DC

## 2021-10-25 MED ORDER — ONDANSETRON HCL 4 MG/2ML IJ SOLN
INTRAMUSCULAR | Status: DC | PRN
  Administered 2021-10-25: 4 mg via INTRAVENOUS

## 2021-10-25 MED ORDER — OXYMETAZOLINE HCL 0.05 % NA SOLN
NASAL | Status: AC
  Filled 2021-10-25: qty 30

## 2021-10-25 MED ORDER — OXYMETAZOLINE HCL 0.05 % NA SOLN
NASAL | Status: DC | PRN
  Administered 2021-10-25: 1 via TOPICAL

## 2021-10-25 MED ORDER — PROPOFOL 500 MG/50ML IV EMUL
INTRAVENOUS | Status: DC | PRN
  Administered 2021-10-25: 100 ug/kg/min via INTRAVENOUS

## 2021-10-25 MED ORDER — PROPOFOL 10 MG/ML IV BOLUS
INTRAVENOUS | Status: AC
  Filled 2021-10-25: qty 20

## 2021-10-25 MED ORDER — AMISULPRIDE (ANTIEMETIC) 5 MG/2ML IV SOLN: 10.0000 mg | Freq: Once | INTRAVENOUS | Status: DC | PRN

## 2021-10-25 MED ORDER — ONDANSETRON HCL 4 MG/2ML IJ SOLN: 4.0000 mg | Freq: Once | INTRAMUSCULAR | Status: DC | PRN

## 2021-10-25 SURGICAL SUPPLY — 15 items
CANISTER SUCT 1200ML W/VALVE (MISCELLANEOUS) ×2 IMPLANT
GLOVE BIOGEL M 7.0 STRL (GLOVE) ×2 IMPLANT
GLOVE BIOGEL PI IND STRL 7.0 (GLOVE) IMPLANT
GLOVE BIOGEL PI INDICATOR 7.0 (GLOVE) ×2
KIT CLEAN ENDO (MISCELLANEOUS) ×2 IMPLANT
NDL HYPO 27GX1-1/4 (NEEDLE) IMPLANT
NEEDLE HYPO 27GX1-1/4 (NEEDLE) IMPLANT
PATTIES SURGICAL .5 X3 (DISPOSABLE) IMPLANT
SHEET MEDIUM DRAPE 40X70 STRL (DRAPES) ×2 IMPLANT
SOL ANTI FOG 6CC (MISCELLANEOUS) ×1 IMPLANT
SOLUTION ANTI FOG 6CC (MISCELLANEOUS) ×1
SPONGE NEURO XRAY DETECT 1X3 (DISPOSABLE) IMPLANT
SYR CONTROL 10ML LL (SYRINGE) IMPLANT
TOWEL GREEN STERILE FF (TOWEL DISPOSABLE) ×2 IMPLANT
TUBE CONNECTING 20X1/4 (TUBING) IMPLANT

## 2021-10-25 NOTE — Anesthesia Postprocedure Evaluation (Signed)
Anesthesia Post Note  Patient: Fred Thompson  Procedure(s) Performed: DRUG INDUCED SLEEP ENDOSCOPY (Nose)     Patient location during evaluation: PACU Anesthesia Type: MAC Level of consciousness: awake and alert Pain management: pain level controlled Vital Signs Assessment: post-procedure vital signs reviewed and stable Respiratory status: spontaneous breathing, nonlabored ventilation and respiratory function stable Cardiovascular status: blood pressure returned to baseline and stable Postop Assessment: no apparent nausea or vomiting Anesthetic complications: no   No notable events documented.  Last Vitals:  Vitals:   10/25/21 1602 10/25/21 1619  BP:  132/82  Pulse: (!) 59 72  Resp: 13 18  Temp:  (!) 36.4 C  SpO2: 97% 99%    Last Pain:  Vitals:   10/25/21 1619  TempSrc: Oral  PainSc: 0-No pain                 Lidia Collum

## 2021-10-25 NOTE — H&P (Signed)
Fred Thompson is an 61 y.o. male.   Chief Complaint: Obstructive sleep apnea HPI: Patient with a history of obstructive sleep apnea, unable to tolerate CPAP therapy.  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 on diltiazem   Hemorrhoids 01/20/2016   LV dysfunction    a. echo 12/2015: EF 45-50%, mild global HK, normal diastolic function, mildly dilated LA, trace MR, trace TR, unable to estimate PASP   PAF (paroxysmal atrial fibrillation) (HCC)    a. diagnosed in 12/2015; b. failed flecainide and multaq; c. CHADS2VASc => 1 (CHF); d. on xarelto; e. s/p successful Afib ablation 09/29/2016   Paroxysmal atrial flutter (HCC) 01/02/2016   ablation 2018   PVC (premature ventricular contraction)    Sleep apnea    uses CPAP nightly    Past Surgical History:  Procedure Laterality Date   APPENDECTOMY  1973   ATRIAL FIBRILLATION ABLATION N/A 09/29/2016   Procedure: Atrial Fibrillation Ablation;  Surgeon: Regan Lemming, MD;  Location: [facility];  Service: Cardiovascular;  Laterality: N/A;   CATARACT EXTRACTION Right 09/2017   has had infection, then oil bubble, the gas bubble 02/06/2018   KNEE SURGERY Right 1999   arthroscopic   NM RENAL LASIX ([facility] HX) Bilateral 2007   TRANSANAL HEMORRHOIDAL DEARTERIALIZATION N/A 07/08/2019   Procedure: TRANSANAL HEMORRHOIDAL DEARTERIALIZATION;  Surgeon: Romie Levee, MD;  Location: [facility];  Service: General;  Laterality: N/A;    Family History  Problem Relation Age of Onset   CAD Father        quad bypass @ 37   Healthy Sister    Social History:  reports that he has never smoked. He has never used smokeless tobacco. He reports current alcohol use. He reports that he does not use drugs.  Allergies:  Allergies  Allergen Reactions   Atorvastatin Other  (See Comments)    Fatigue, myalgias Other reaction(s): Other (See Comments) Fatigue, myalgias    Medications Prior to Admission  Medication Sig Dispense Refill   brimonidine (ALPHAGAN) 0.2 % ophthalmic solution Place 1 drop into the right eye in the morning and at bedtime.      cholecalciferol (VITAMIN D3) 25 MCG (1000 UNIT) tablet Take 1,000 Units by mouth daily.     diltiazem (TIADYLT ER) 180 MG 24 hr capsule Take 1 capsule (180 mg total) by mouth daily. 90 capsule 3   dorzolamide-timolol (COSOPT) 22.3-6.8 MG/ML ophthalmic solution Place 2 drops into the right eye daily.     finasteride (PROSCAR) 5 MG tablet Take 5 mg by mouth daily.     meloxicam (MOBIC) 15 MG tablet Take 15 mg by mouth as needed.     rosuvastatin (CRESTOR) 40 MG tablet Take 1 tablet (40 mg total) by mouth daily. 90 tablet 3   vitamin E 400 UNIT capsule Take 400 Units by mouth daily.      No results found for this or any previous visit (from the past 48 hour(s)). No results found.  Review of Systems  Constitutional: Negative.   HENT:  Positive for congestion.   Respiratory:  Positive for apnea.     Blood pressure 124/88, pulse 65, temperature (!) 97 F (36.1 C), temperature source Oral, resp. rate 14, height 6\' 5"  (1.956 m), weight 100.7 kg, SpO2 98 %. Physical Exam Constitutional:  Appearance: He is normal weight.  Cardiovascular:     Rate and Rhythm: Normal rate.  Musculoskeletal:     Cervical back: Normal range of motion.  Neurological:     Mental Status: He is alert.      Assessment/Plan Patient admitted for outpatient surgery under sedated anesthesia: Drug-induced sleep endoscopy.  Osborn Coho, MD 10/25/2021, 3:01 PM

## 2021-10-25 NOTE — Discharge Instructions (Signed)

## 2021-10-25 NOTE — Anesthesia Preprocedure Evaluation (Signed)
Anesthesia Evaluation  Patient identified by MRN, date of birth, ID band Patient awake    Reviewed: Allergy & Precautions, NPO status , Patient's Chart, lab work & pertinent test results  History of Anesthesia Complications Negative for: history of anesthetic complications  Airway Mallampati: II  TM Distance: >3 FB Neck ROM: Full    Dental   Pulmonary sleep apnea and Continuous Positive Airway Pressure Ventilation ,    Pulmonary exam normal        Cardiovascular +CHF (recovered)  Normal cardiovascular exam+ dysrhythmias Atrial Fibrillation    Echo 09/12/21: EF 60%, normal global wall motion, normal DF, no significant valvular abnormality   Neuro/Psych negative neurological ROS     GI/Hepatic negative GI ROS, Neg liver ROS,   Endo/Other  negative endocrine ROS  Renal/GU negative Renal ROS  negative genitourinary   Musculoskeletal negative musculoskeletal ROS (+)   Abdominal   Peds  Hematology negative hematology ROS (+)   Anesthesia Other Findings   Reproductive/Obstetrics                             Anesthesia Physical Anesthesia Plan  ASA: 2  Anesthesia Plan: MAC   Post-op Pain Management: Minimal or no pain anticipated   Induction: Intravenous  PONV Risk Score and Plan: 1 and Propofol infusion, TIVA and Treatment may vary due to age or medical condition  Airway Management Planned: Natural Airway, Nasal Cannula and Simple Face Mask  Additional Equipment: None  Intra-op Plan:   Post-operative Plan:   Informed Consent: I have reviewed the patients History and Physical, chart, labs and discussed the procedure including the risks, benefits and alternatives for the proposed anesthesia with the patient or authorized representative who has indicated his/her understanding and acceptance.       Plan Discussed with:   Anesthesia Plan Comments:         Anesthesia Quick  Evaluation

## 2021-10-25 NOTE — Transfer of Care (Signed)
Immediate Anesthesia Transfer of Care Note  Patient: Fred Thompson  Procedure(s) Performed: DRUG INDUCED SLEEP ENDOSCOPY (Nose)  Patient Location: PACU  Anesthesia Type:MAC  Level of Consciousness: awake, alert  and oriented  Airway & Oxygen Therapy: Patient Spontanous Breathing and Patient connected to nasal cannula oxygen  Post-op Assessment: Report given to RN and Post -op Vital signs reviewed and stable  Post vital signs: Reviewed and stable  Last Vitals:  Vitals Value Taken Time  BP    Temp    Pulse 60 10/25/21 1548  Resp    SpO2 96 % 10/25/21 1548  Vitals shown include unvalidated device data.  Last Pain:  Vitals:   10/25/21 1317  TempSrc: Oral  PainSc: 0-No pain         Complications: No notable events documented.

## 2021-10-25 NOTE — Op Note (Signed)
Operative Note: DRUG INDUCED SLEEP ENDOSCOPY  Patient: Fred Thompson  Medical record number: [id number]  Date:10/25/2021  Pre-operative Indications: 1.  Obstructive Sleep Apnea  Postoperative Indications: Same  Surgical Procedure: 1.  Drug Induced Sleep Endoscopy (DISE)  Anesthesia: MAC with IV sedation  Surgeon: Delsa Bern, M.D.  Complications: None  BMI: 26.3 kg/m  EBL: None  Findings: There is no evidence of complete concentric palatal obstruction.  Anatomically the patient should be a candidate for hypoglossal nerve stimulation therapy.     Brief History: The patient is a 61 y.o. male with a history of obstructive sleep apnea. The patient has undergone previous sleep study which showed mild to moderate levels of obstructive sleep apnea.  The patient was prescribed CPAP which they consistently attempted to use without success.  Given the patient's history and findings, the above drug-induced sleep endoscopy was recommended to assess the patient's anatomic level of apnea.  Risks and benefits were discussed in detail with the patient today understand and agree with our plan for surgery which is scheduled at [facility] under sedated anesthesia as an outpatient.  Surgical Procedure: The patient is brought to the operating room on 10/25/2021 and placed in supine position on the operating table.  Intravenous sedated anesthesia was established without difficulty using the standard drug-induced sleep endoscopy protocol. When the patient was adequately anesthetized, surgical timeout was performed and correct identification of the patient and the surgical procedure.    A propofol infusion was administered and the patient was monitored carefully to achieve a level of sedation appropriate for DISE.  The patient did not respond to verbal commands but still had spontaneous respiration, sleep disordered breathing and associated desaturations were observed.  With the patient under adequate  sedated anesthesia the flexible nasal laryngoscope was passed without difficulty.  The patient's nasal cavity showed mild septal deviation.  The endoscope was then passed to visualize the velopharynx, oropharynx, tongue base and epiglottis to assess areas of obstruction.  Patient's airway showed airway obstruction from anterior to posterior along the soft palate and base of tongue.   There was no evidence of complete concentric palatal obstruction and the patient appeared to be a candidate anatomically for hypoglossal nerve stimulation therapy.  Surgical sponge count was correct. Patient was awakened from anesthetic and transferred from the operating room to the recovery room in stable condition. There were no complications and no blood loss.   Delsa Bern, M.D. [facility] 10/25/2021

## 2021-10-26 ENCOUNTER — Encounter (HOSPITAL_BASED_OUTPATIENT_CLINIC_OR_DEPARTMENT_OTHER): Payer: Self-pay | Admitting: Otolaryngology

## 2021-11-02 DIAGNOSIS — G4733 Obstructive sleep apnea (adult) (pediatric): Secondary | ICD-10-CM | POA: Diagnosis not present

## 2021-11-16 ENCOUNTER — Other Ambulatory Visit: Payer: Self-pay | Admitting: Neurology

## 2021-11-16 DIAGNOSIS — G4733 Obstructive sleep apnea (adult) (pediatric): Secondary | ICD-10-CM

## 2021-11-23 ENCOUNTER — Telehealth: Payer: Self-pay | Admitting: Neurology

## 2021-11-23 NOTE — Telephone Encounter (Signed)
[employer] pending faxed notes.  

## 2021-12-08 NOTE — Telephone Encounter (Signed)
LVM for pt to call back to schedule   [employer] no auth req spoke to [place] ref # [id number]

## 2021-12-14 NOTE — Telephone Encounter (Signed)
[employer]- [employer] no auth req spoke to [place] ref # [id number].  Patient is scheduled at [facility] for 01/19/22 at 9 pm.  Mailed packet to the patient.

## 2021-12-22 DIAGNOSIS — Z79899 Other long term (current) drug therapy: Secondary | ICD-10-CM | POA: Diagnosis not present

## 2021-12-22 DIAGNOSIS — Z0000 Encounter for general adult medical examination without abnormal findings: Secondary | ICD-10-CM | POA: Diagnosis not present

## 2021-12-22 DIAGNOSIS — I4891 Unspecified atrial fibrillation: Secondary | ICD-10-CM | POA: Diagnosis not present

## 2021-12-22 DIAGNOSIS — Z23 Encounter for immunization: Secondary | ICD-10-CM | POA: Diagnosis not present

## 2021-12-22 DIAGNOSIS — Z131 Encounter for screening for diabetes mellitus: Secondary | ICD-10-CM | POA: Diagnosis not present

## 2021-12-22 DIAGNOSIS — E7800 Pure hypercholesterolemia, unspecified: Secondary | ICD-10-CM | POA: Diagnosis not present

## 2022-01-05 ENCOUNTER — Ambulatory Visit: Payer: BC Managed Care – PPO | Admitting: Podiatry

## 2022-01-12 ENCOUNTER — Ambulatory Visit (INDEPENDENT_AMBULATORY_CARE_PROVIDER_SITE_OTHER): Payer: BC Managed Care – PPO

## 2022-01-12 ENCOUNTER — Ambulatory Visit (INDEPENDENT_AMBULATORY_CARE_PROVIDER_SITE_OTHER): Payer: BC Managed Care – PPO | Admitting: Podiatry

## 2022-01-12 DIAGNOSIS — M79672 Pain in left foot: Secondary | ICD-10-CM

## 2022-01-12 DIAGNOSIS — Q6672 Congenital pes cavus, left foot: Secondary | ICD-10-CM | POA: Diagnosis not present

## 2022-01-12 DIAGNOSIS — Q66221 Congenital metatarsus adductus, right foot: Secondary | ICD-10-CM | POA: Diagnosis not present

## 2022-01-12 DIAGNOSIS — L84 Corns and callosities: Secondary | ICD-10-CM | POA: Diagnosis not present

## 2022-01-12 DIAGNOSIS — Q66229 Congenital metatarsus adductus, unspecified foot: Secondary | ICD-10-CM | POA: Diagnosis not present

## 2022-01-12 DIAGNOSIS — M778 Other enthesopathies, not elsewhere classified: Secondary | ICD-10-CM

## 2022-01-12 NOTE — Progress Notes (Signed)
Subjective:   Patient ID: Fred Thompson, male   DOB: 61 y.o.   MRN: [id number]   HPI Chief Complaint  Patient presents with   Callouses    Left foot callus started 3 months ago, Rate of pain 5 out of 10, feels like walking on the bone,    61 year old male presents with above concerns.  Walks on concrete floors which aggravates his symptoms. No injury/treatment.  No swelling redness or drainage.   Review of Systems  All other systems reviewed and are negative.  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 on diltiazem   Hemorrhoids 01/20/2016   LV dysfunction    a. echo 12/2015: EF 45-50%, mild global HK, normal diastolic function, mildly dilated LA, trace MR, trace TR, unable to estimate PASP   PAF (paroxysmal atrial fibrillation) (HCC)    a. diagnosed in 12/2015; b. failed flecainide and multaq; c. CHADS2VASc => 1 (CHF); d. on xarelto; e. s/p successful Afib ablation 09/29/2016   Paroxysmal atrial flutter (HCC) 01/02/2016   ablation 2018   PVC (premature ventricular contraction)    Sleep apnea    uses CPAP nightly    Past Surgical History:  Procedure Laterality Date   APPENDECTOMY  1973   ATRIAL FIBRILLATION ABLATION N/A 09/29/2016   Procedure: Atrial Fibrillation Ablation;  Surgeon: Regan Lemming, MD;  Location: [facility];  Service: Cardiovascular;  Laterality: N/A;   CATARACT EXTRACTION Right 09/2017   has had infection, then oil bubble, the gas bubble 02/06/2018   DRUG INDUCED ENDOSCOPY N/A 10/25/2021   Procedure: DRUG INDUCED SLEEP ENDOSCOPY;  Surgeon: Osborn Coho, MD;  Location: [facility];  Service: ENT;  Laterality: N/A;   KNEE SURGERY Right 1999   arthroscopic   NM RENAL LASIX ([facility] HX) Bilateral 2007   TRANSANAL HEMORRHOIDAL DEARTERIALIZATION N/A 07/08/2019    Procedure: TRANSANAL HEMORRHOIDAL DEARTERIALIZATION;  Surgeon: Romie Levee, MD;  Location: [facility];  Service: General;  Laterality: N/A;     Current Outpatient Medications:    brimonidine (ALPHAGAN) 0.2 % ophthalmic solution, Place 1 drop into the right eye in the morning and at bedtime. , Disp: , Rfl:    cholecalciferol (VITAMIN D3) 25 MCG (1000 UNIT) tablet, Take 1,000 Units by mouth daily., Disp: , Rfl:    diltiazem (TIADYLT ER) 180 MG 24 hr capsule, Take 1 capsule (180 mg total) by mouth daily., Disp: 90 capsule, Rfl: 3   dorzolamide-timolol (COSOPT) 22.3-6.8 MG/ML ophthalmic solution, Place 2 drops into the right eye daily., Disp: , Rfl:    finasteride (PROSCAR) 5 MG tablet, Take 5 mg by mouth daily., Disp: , Rfl:    meloxicam (MOBIC) 15 MG tablet, Take 15 mg by mouth as needed., Disp: , Rfl:    rosuvastatin (CRESTOR) 40 MG tablet, Take 1 tablet (40 mg total) by mouth daily., Disp: 90 tablet, Rfl: 3   vitamin E 400 UNIT capsule, Take 400 Units by mouth daily., Disp: , Rfl:   Allergies  Allergen Reactions   Atorvastatin Other (See Comments)    Fatigue, myalgias Other reaction(s): Other (See Comments) Fatigue, myalgias         Objective:  Physical Exam  General: AAO x3, NAD  Dermatological: Mild hyperkeratotic tissue left submetatarsal 5 without any underlying ulceration drainage or signs of infection.  No open lesions.  Vascular: Dorsalis Pedis artery and Posterior Tibial artery pedal pulses are 2/4 bilateral with immedate capillary fill time. There is no pain with calf compression, swelling, warmth, erythema.   Neruologic: Grossly intact via light touch bilateral.   Musculoskeletal: There is mild edema about the PIPJ but there is no erythema or warmth.  Tender to palpation plantar submetatarsal 5.  No other areas of pinpoint tenderness.  Cavus foot type present.  Gait: Unassisted, Nonantalgic.       Assessment:    61 year old male with  capsulitis, cavus deformity     Plan:  -Treatment options discussed including all alternatives, risks, and complications -Etiology of symptoms were discussed -X-rays were obtained and reviewed with the patient.  3 views left foot were obtained.  No evidence of acute fracture noted.  Metatarsus adductus noted. -This agrees to debride the callus without complications or bleeding as this was minimal.  Discussed anti-inflammatories and also offered steroid injection if needed.  I do think try get the pressure off this will be beneficial.  Discussed offloading pads but also discussed custom inserts. Don Perking, from our orthotics department, contacted his insurance while the patient was in the room and stated that it was covered based on his diagnosis codes.  He was measured for orthotics today.  Help with so help offload as well.  Trula Slade DPM      Put in charges

## 2022-01-17 DIAGNOSIS — H44001 Unspecified purulent endophthalmitis, right eye: Secondary | ICD-10-CM | POA: Diagnosis not present

## 2022-01-17 DIAGNOSIS — H35371 Puckering of macula, right eye: Secondary | ICD-10-CM | POA: Diagnosis not present

## 2022-01-17 DIAGNOSIS — Z961 Presence of intraocular lens: Secondary | ICD-10-CM | POA: Diagnosis not present

## 2022-01-17 DIAGNOSIS — Z9889 Other specified postprocedural states: Secondary | ICD-10-CM | POA: Diagnosis not present

## 2022-01-19 ENCOUNTER — Ambulatory Visit (INDEPENDENT_AMBULATORY_CARE_PROVIDER_SITE_OTHER): Payer: BC Managed Care – PPO | Admitting: Neurology

## 2022-01-19 ENCOUNTER — Other Ambulatory Visit: Payer: Self-pay | Admitting: Podiatry

## 2022-01-19 DIAGNOSIS — Q6672 Congenital pes cavus, left foot: Secondary | ICD-10-CM

## 2022-01-19 DIAGNOSIS — G4733 Obstructive sleep apnea (adult) (pediatric): Secondary | ICD-10-CM | POA: Diagnosis not present

## 2022-01-19 DIAGNOSIS — I480 Paroxysmal atrial fibrillation: Secondary | ICD-10-CM

## 2022-01-19 DIAGNOSIS — M778 Other enthesopathies, not elsewhere classified: Secondary | ICD-10-CM

## 2022-01-19 DIAGNOSIS — L84 Corns and callosities: Secondary | ICD-10-CM

## 2022-01-19 DIAGNOSIS — Z86010 Personal history of colonic polyps: Secondary | ICD-10-CM | POA: Diagnosis not present

## 2022-01-19 DIAGNOSIS — Q66229 Congenital metatarsus adductus, unspecified foot: Secondary | ICD-10-CM

## 2022-01-19 DIAGNOSIS — R55 Syncope and collapse: Secondary | ICD-10-CM

## 2022-01-19 DIAGNOSIS — D709 Neutropenia, unspecified: Secondary | ICD-10-CM

## 2022-01-19 DIAGNOSIS — M79672 Pain in left foot: Secondary | ICD-10-CM

## 2022-01-21 NOTE — Procedures (Signed)
[facility] POLYSOMNOGRAPHY  INTERPRETATION REPORT   STUDY DATE:  01/19/2022     PATIENT NAME:  Fred Thompson         DATE OF BIRTH:  May 30, 1960  PATIENT ID:  [id number]    TYPE OF STUDY:  PSG  READING PHYSICIAN: Larey Seat, MD REFERRED BY: Ward Givens, NP  SCORING TECHNICIAN: Gaylyn Cheers, RPSGT   HISTORY:  SAAD BUHL is 61 year-old experienced CPAP user ( 12 cm pressure, 3 cm H20 EPR setting)  but would like to see if he could qualify for an inspire device. He has a history of heart disease, atrial flutter and SOB , followed by Dr Einar Gip who referred him in 2018. Epworth score was 15 at the time and decreased to 5/ 24 on CPAP  less than 4 months into therapy- meanwhile 0/24. AHI was supine position dependent. He feels better since using CPAP but is also somewhat tired of it. He has seen ENT on 10-25-2021 for evaluation.  ADDITIONAL INFORMATION:  The Epworth Sleepiness Scale endorsed at 0 /24 points while on CPAP (scores above or equal to 10 are suggestive of hypersomnolence). FSS endorsed at  [facility] /63 points. Height: 77 in Weight: 220 lbs (BMI 26)  Neck Size: NA   MEDICATIONS: Alphagan, Cholecalciferol, Tiadylt ER, Cosopt, Proscar, Mobic, Fiber 7 PO, Crestor, Vitamin E TECHNICAL DESCRIPTION: A registered sleep technologist ( RPSGT)  was in attendance for the duration of the recording.  Data collection, scoring, video monitoring, and reporting were performed in compliance with the AASM Manual for the Scoring of Sleep and Associated Events; (Hypopnea is scored based on the criteria listed in Section VIII D. 1b in the AASM Manual V2.6 using a 4% oxygen desaturation rule or Hypopnea is scored based on the criteria listed in Section VIII D. 1a in the AASM Manual V2.6 using 3% oxygen desaturation and /or arousal rule).   SLEEP CONTINUITY AND SLEEP ARCHITECTURE:  Lights-out was at 21:51: and lights-on at  05:00:, with  7.2 minutes of recording time .  Total sleep time ( TST) was 316.5 minutes with a decreased sleep efficiency at 73.8%. Sleep latency was normal at 12.5 minutes.  REM sleep latency was increased at 155.5 minutes. Of the total sleep time, the percentage of stage N1 sleep was 9.3%, stage N2 sleep was 78%, stage N3 sleep was 2.8%, and REM sleep was 10.1%.  There were 3 Stage R periods observed on this study night, 42 awakenings (i.e. transitions to Stage W from any sleep stage), and 124 total stage transitions.    BODY POSITION:  TST was divided between the following sleep positions: supine 150 minutes (48%), non-supine 166 minutes (52%); right 05 minutes (2%), left 161 minutes (51%), and prone 00 minutes (0%). Total supine REM sleep time was 16 minutes (50% of total REM sleep).  RESPIRATORY MONITORING:   Based on AASM criteria (using a 3% oxygen desaturation and /or arousal rule for scoring hypopneas), there were 118 apneas (86 obstructive; 9 centrals; 23 mixed), and 65 hypopneas.  Apnea index was 22.4/h. Hypopnea index was 12.3/h.  The AHI (apnea-hypopnea index) was 34.7/h overall. The AHI was 60.2/h supine, 38/h in non-supine; 45.0/h in REM, and 33.5/h in non-REM sleep- 52.5/h in supine REM sleep. There were 10 respiratory effort-related arousals (RERAs).  The RERA index was 2 events/h. Total respiratory disturbance index (RDI) was 36.6 events/h. RDI results showed: supine RDI  60.6 /h; non-supine RDI 14.8 /h; REM RDI 46.9 /h,  supine REM RDI 52.5 /h. Mild snoring was present.   OXIMETRY: Oxyhemoglobin Saturation Nadir was 87% from a mean of 95%.  Of the Total sleep time (TST) hypoxemia (<89%) was present for 3.5 minutes, or 1.1% of total sleep time.   LIMB MOVEMENTS: There were 24 periodic limb movements of sleep (4.5/h), of which 0 (0.0/h) were associated with an arousal. AROUSAL: There were 110 arousals in total, for an arousal index of 17 arousals/hour.  Of these, 80 were identified as respiratory-related arousals (15 /h), 0 were  PLM-related arousals (0 /h), and 30 were non-specific arousals (6 /h). There were 0 occurrences of Cheyne Stokes breathing EEG:  PSG EEG was of normal amplitude and frequency, with symmetric manifestation of sleep stages.  EKG: The electrocardiogram documented SR.  The average heart rate during sleep was 64 bpm.  The heart rate during sleep varied between a minimum of 56 bpm and a maximum of  84 bpm.  AUDIO and VIDEO: No complex movements, vocalizations or phonetic expressions were captured.    IMPRESSION: This patient presented with complex sleep apnea ( there are mixed and central events too) but dominantly obstructive sleep apnea and at a severe degree.  Sleep apnea and hypopnea caused the majority of arousals, leading to sleep fragmentation.  Total sleep time was reduced at 316.5 minutes.  Sleep efficiency was decreased at 73.8%.   There was exacerbation of apnoic events in supine and during REM sleep, but this is not REM sleep dependent apnea.  This is supine positional dependent sleep apnea.     RECOMMENDATIONS: This type of apnea and the patient's BMI would allow for Inspire use. There is a caveat with all surgical procedures and implanted devices, the risks arising from anesthesia and infection. I would like to set the expectation that an inspire device can reduce the NREM sleep part of apnea but works not as effectively on the hypopnea or REM sleep apnea events.  The patient will still need to avoid supine sleep.    Plan B: If the patient is willing to continue CPAP therapy, I will order a new ResMed autotitration device , a setting between 5 cm - 15 cm water pressure,  with 3 cm EPR and with heated humidification, with interface of patient's choice.   Melvyn Novas,  MD PS: I will send this result to Butch Penny, NP

## 2022-01-21 NOTE — Progress Notes (Signed)
IMPRESSION: This patient presented with complex sleep apnea ( there are mixed and central events too) but dominantly obstructive sleep apnea and at a severe degree.  Sleep apnea and hypopnea caused the majority of arousals, leading to sleep fragmentation.  There was exacerbation of apnoic events in supine sleep and during REM sleep, but this is not REM sleep dependent apnea. This is supine positional dependent sleep apnea.     This type of apnea and the patient's BMI would allow for Inspire use:  There is a caveat with all surgical procedures and implanted devices, the risks arising from anesthesia and infection. I would like to set the expectation that an inspire device can reduce the NREM sleep part of apnea but works not as effectively on the hypopnea or REM sleep apnea events. Treatment on inspire device for this type of apnea is likely to reduce the AHI by 60-80%.   The patient will still need to avoid supine sleep.   Plan B: If the patient is willing to continue CPAP therapy, I would order a new ResMed autotitration device , a setting between 5 cm - 15 cm water pressure,  with 3 cm EPR and with heated humidification, with interface of patient's choice.

## 2022-01-23 ENCOUNTER — Encounter: Payer: Self-pay | Admitting: Neurology

## 2022-02-15 ENCOUNTER — Telehealth: Payer: Self-pay | Admitting: Adult Health

## 2022-02-15 DIAGNOSIS — G4733 Obstructive sleep apnea (adult) (pediatric): Secondary | ICD-10-CM | POA: Diagnosis not present

## 2022-02-15 NOTE — Telephone Encounter (Signed)
Pt called wanting to discuss last message he was having with RN regarding the Inspire.

## 2022-02-16 NOTE — Telephone Encounter (Signed)
Called the pt back. Reviewed his questions. He was confused on the response with the [employer]. He has completed the work up with Dr Wilburn Cornelia to move forward with inspire. He was wanting numbers/percentages of how the treatment with CPAP would compare to the treatment with inspire.  Advised that Dr Brett Fairy feels that with the apnea events that he was having at the 34.7 times an hour, she didn't feel that inspire would be as effective in treating vs the CPAP.  She does feel he will get benefit with inspire just may not be as effective as CPAP. Pt met with Dr Wilburn Cornelia and feels that he will get him treated effectively.  Pt is scheduled 02/28/2022 with Dr Wilburn Cornelia to get the inspire implant. Advised I would pass this along to our sleep lab manager so that she is also aware of his getting the device since he will have to follow up within 4-6 weeks post implant. Pt verbalized understanding and was appreciative for the information.

## 2022-02-20 ENCOUNTER — Other Ambulatory Visit: Payer: Self-pay | Admitting: Otolaryngology

## 2022-02-22 ENCOUNTER — Encounter (HOSPITAL_BASED_OUTPATIENT_CLINIC_OR_DEPARTMENT_OTHER): Payer: Self-pay | Admitting: Otolaryngology

## 2022-02-23 ENCOUNTER — Ambulatory Visit (INDEPENDENT_AMBULATORY_CARE_PROVIDER_SITE_OTHER): Payer: BC Managed Care – PPO | Admitting: Podiatry

## 2022-02-23 VITALS — BP 114/67 | HR 62

## 2022-02-23 DIAGNOSIS — M778 Other enthesopathies, not elsewhere classified: Secondary | ICD-10-CM | POA: Diagnosis not present

## 2022-02-23 DIAGNOSIS — Q6672 Congenital pes cavus, left foot: Secondary | ICD-10-CM

## 2022-02-23 DIAGNOSIS — M7742 Metatarsalgia, left foot: Secondary | ICD-10-CM

## 2022-02-23 NOTE — Patient Instructions (Signed)

## 2022-02-25 NOTE — Progress Notes (Signed)
Subjective: Chief Complaint  Patient presents with   Bunions    Left foot pain, patient states that the metatarsal pad had helped with the pain a lot, Rate of pain 2 out of 10,  Patient has been give Orthotics today    61 year old male presents the above concerns.  States the gel metatarsal pad has been very helpful.  He presents in pick up orthotics.  No new concerns today.   Objective: AAO x3, NAD DP/PT pulses palpable bilaterally, CRT less than 3 seconds Service tenderness left foot submetatarsal area but overall much improved.  Prominent metatarsal heads plantarly with atrophy of the fat pad.  Cavus foot type present. No pain with calf compression, swelling, warmth, erythema  Assessment: Metatarsalgia, capsulitis left foot  Plan: -All treatment options discussed with the patient including all alternatives, risks, complications.  -Orthotics were dispensed today.  Oral and written break-in instructions provided to the patient.  Hope this will help offload.  If this is helpful then we can hold off on using the gel metatarsal pad.  He can use this and other shoes with orthotics do not fit.  If he needs any adjustments the orthotics let us know. -Patient encouraged to call the office with any questions, concerns, change in symptoms.    Vivi Barrack DPM

## 2022-02-28 ENCOUNTER — Ambulatory Visit (HOSPITAL_COMMUNITY): Payer: BC Managed Care – PPO

## 2022-02-28 ENCOUNTER — Encounter (HOSPITAL_BASED_OUTPATIENT_CLINIC_OR_DEPARTMENT_OTHER)
Admission: RE | Disposition: A | Discharge status: Home / Self Care | Payer: Self-pay | Source: Home / Self Care | Attending: Otolaryngology

## 2022-02-28 ENCOUNTER — Other Ambulatory Visit: Payer: Self-pay

## 2022-02-28 ENCOUNTER — Encounter (HOSPITAL_BASED_OUTPATIENT_CLINIC_OR_DEPARTMENT_OTHER): Payer: Self-pay | Admitting: Otolaryngology

## 2022-02-28 ENCOUNTER — Ambulatory Visit (HOSPITAL_BASED_OUTPATIENT_CLINIC_OR_DEPARTMENT_OTHER): Payer: BC Managed Care – PPO | Admitting: Certified Registered"

## 2022-02-28 ENCOUNTER — Ambulatory Visit (HOSPITAL_BASED_OUTPATIENT_CLINIC_OR_DEPARTMENT_OTHER)
Admission: RE | Admit: 2022-02-28 | Discharge: 2022-02-28 | Disposition: A | Discharge status: Home / Self Care | Payer: BC Managed Care – PPO | Attending: Otolaryngology | Admitting: Otolaryngology

## 2022-02-28 DIAGNOSIS — Z789 Other specified health status: Secondary | ICD-10-CM | POA: Diagnosis not present

## 2022-02-28 DIAGNOSIS — Z6827 Body mass index (BMI) 27.0-27.9, adult: Secondary | ICD-10-CM | POA: Diagnosis not present

## 2022-02-28 DIAGNOSIS — I700 Atherosclerosis of aorta: Secondary | ICD-10-CM | POA: Insufficient documentation

## 2022-02-28 DIAGNOSIS — M1990 Unspecified osteoarthritis, unspecified site: Secondary | ICD-10-CM | POA: Diagnosis not present

## 2022-02-28 DIAGNOSIS — Z969 Presence of functional implant, unspecified: Secondary | ICD-10-CM | POA: Diagnosis not present

## 2022-02-28 DIAGNOSIS — G4733 Obstructive sleep apnea (adult) (pediatric): Secondary | ICD-10-CM | POA: Diagnosis not present

## 2022-02-28 DIAGNOSIS — J9811 Atelectasis: Secondary | ICD-10-CM | POA: Diagnosis not present

## 2022-02-28 DIAGNOSIS — Z9989 Dependence on other enabling machines and devices: Secondary | ICD-10-CM | POA: Diagnosis not present

## 2022-02-28 DIAGNOSIS — Z01818 Encounter for other preprocedural examination: Secondary | ICD-10-CM

## 2022-02-28 HISTORY — PX: IMPLANTATION OF HYPOGLOSSAL NERVE STIMULATOR: SHX6827

## 2022-02-28 SURGERY — INSERTION, HYPOGLOSSAL NERVE STIMULATOR
Anesthesia: General | Site: Chest | Laterality: Right

## 2022-02-28 MED ORDER — LIDOCAINE-EPINEPHRINE 1 %-1:100000 IJ SOLN
INTRAMUSCULAR | Status: DC | PRN
  Administered 2022-02-28: 5 mL

## 2022-02-28 MED ORDER — OXYCODONE HCL 5 MG PO TABS
5.0000 mg | ORAL_TABLET | Freq: Once | ORAL | Status: AC | PRN
  Administered 2022-02-28: 5 mg via ORAL

## 2022-02-28 MED ORDER — ONDANSETRON HCL 4 MG/2ML IJ SOLN: 4.0000 mg | Freq: Once | INTRAMUSCULAR | Status: DC | PRN

## 2022-02-28 MED ORDER — ACETAMINOPHEN 500 MG PO TABS
ORAL_TABLET | ORAL | Status: AC
  Filled 2022-02-28: qty 2

## 2022-02-28 MED ORDER — MIDAZOLAM HCL 2 MG/2ML IJ SOLN
INTRAMUSCULAR | Status: AC
  Filled 2022-02-28: qty 2

## 2022-02-28 MED ORDER — LIDOCAINE 2% (20 MG/ML) 5 ML SYRINGE
INTRAMUSCULAR | Status: DC | PRN
  Administered 2022-02-28: 60 mg via INTRAVENOUS

## 2022-02-28 MED ORDER — AMISULPRIDE (ANTIEMETIC) 5 MG/2ML IV SOLN: 10.0000 mg | Freq: Once | INTRAVENOUS | Status: DC | PRN

## 2022-02-28 MED ORDER — ACETAMINOPHEN 500 MG PO TABS
1000.0000 mg | ORAL_TABLET | Freq: Once | ORAL | Status: AC
  Administered 2022-02-28: 1000 mg via ORAL

## 2022-02-28 MED ORDER — FENTANYL CITRATE (PF) 100 MCG/2ML IJ SOLN
INTRAMUSCULAR | Status: AC
  Filled 2022-02-28: qty 2

## 2022-02-28 MED ORDER — CEFAZOLIN SODIUM-DEXTROSE 2-4 GM/100ML-% IV SOLN
INTRAVENOUS | Status: AC
  Filled 2022-02-28: qty 100

## 2022-02-28 MED ORDER — 0.9 % SODIUM CHLORIDE (POUR BTL) OPTIME
TOPICAL | Status: DC | PRN
  Administered 2022-02-28: 1000 mL

## 2022-02-28 MED ORDER — OXYCODONE HCL 5 MG/5ML PO SOLN: 5.0000 mg | Freq: Once | ORAL | Status: AC | PRN

## 2022-02-28 MED ORDER — FENTANYL CITRATE (PF) 100 MCG/2ML IJ SOLN
INTRAMUSCULAR | Status: DC | PRN
  Administered 2022-02-28 (×2): 50 ug via INTRAVENOUS

## 2022-02-28 MED ORDER — LACTATED RINGERS IV SOLN: INTRAVENOUS | Status: DC

## 2022-02-28 MED ORDER — MIDAZOLAM HCL 5 MG/5ML IJ SOLN
INTRAMUSCULAR | Status: DC | PRN
  Administered 2022-02-28: 2 mg via INTRAVENOUS

## 2022-02-28 MED ORDER — PROPOFOL 10 MG/ML IV BOLUS
INTRAVENOUS | Status: DC | PRN
  Administered 2022-02-28: 150 mg via INTRAVENOUS

## 2022-02-28 MED ORDER — HYDROCODONE-ACETAMINOPHEN 5-325 MG PO TABS: ORAL_TABLET | ORAL | 0 refills | Status: DC

## 2022-02-28 MED ORDER — FENTANYL CITRATE (PF) 100 MCG/2ML IJ SOLN: 25.0000 ug | INTRAMUSCULAR | Status: DC | PRN

## 2022-02-28 MED ORDER — DEXAMETHASONE SODIUM PHOSPHATE 4 MG/ML IJ SOLN
INTRAMUSCULAR | Status: DC | PRN
  Administered 2022-02-28: 4 mg via INTRAVENOUS

## 2022-02-28 MED ORDER — SUCCINYLCHOLINE CHLORIDE 200 MG/10ML IV SOSY
PREFILLED_SYRINGE | INTRAVENOUS | Status: DC | PRN
  Administered 2022-02-28: 120 mg via INTRAVENOUS

## 2022-02-28 MED ORDER — PHENYLEPHRINE HCL-NACL 20-0.9 MG/250ML-% IV SOLN
INTRAVENOUS | Status: DC | PRN
  Administered 2022-02-28: 20 ug/min via INTRAVENOUS

## 2022-02-28 MED ORDER — OXYCODONE HCL 5 MG PO TABS
ORAL_TABLET | ORAL | Status: AC
  Filled 2022-02-28: qty 1

## 2022-02-28 MED ORDER — CEFAZOLIN SODIUM-DEXTROSE 2-4 GM/100ML-% IV SOLN
2.0000 g | INTRAVENOUS | Status: AC
  Administered 2022-02-28: 2 g via INTRAVENOUS

## 2022-02-28 MED ORDER — ONDANSETRON HCL 4 MG/2ML IJ SOLN
INTRAMUSCULAR | Status: DC | PRN
  Administered 2022-02-28: 4 mg via INTRAVENOUS

## 2022-02-28 MED ORDER — PROPOFOL 500 MG/50ML IV EMUL
INTRAVENOUS | Status: DC | PRN
  Administered 2022-02-28: 35 ug/kg/min via INTRAVENOUS

## 2022-02-28 SURGICAL SUPPLY — 70 items
ACC NRSTM 4 TRQ WRNCH STRL (MISCELLANEOUS)
ADH SKN CLS APL DERMABOND .7 (GAUZE/BANDAGES/DRESSINGS) ×1
ATTRACTOMAT 16X20 MAGNETIC DRP (DRAPES) IMPLANT
BLADE CLIPPER SURG (BLADE) IMPLANT
BLADE SURG 15 STRL LF DISP TIS (BLADE) ×1 IMPLANT
BLADE SURG 15 STRL SS (BLADE) ×1
CANISTER SUCT 1200ML W/VALVE (MISCELLANEOUS) ×1 IMPLANT
CORD BIPOLAR FORCEPS 12FT (ELECTRODE) ×1 IMPLANT
COVER PROBE CYLINDRICAL 5X96 (MISCELLANEOUS) ×1 IMPLANT
DERMABOND ADVANCED .7 DNX12 (GAUZE/BANDAGES/DRESSINGS) ×1 IMPLANT
DRAPE C-ARM 35X43 STRL (DRAPES) ×1 IMPLANT
DRAPE INCISE IOBAN 66X45 STRL (DRAPES) ×1 IMPLANT
DRAPE MICROSCOPE WILD 40.5X102 (DRAPES) ×1 IMPLANT
DRAPE UTILITY XL STRL (DRAPES) ×1 IMPLANT
DRSG TEGADERM 2-3/8X2-3/4 SM (GAUZE/BANDAGES/DRESSINGS) ×2 IMPLANT
DRSG TEGADERM 4X4.75 (GAUZE/BANDAGES/DRESSINGS) IMPLANT
ELECT COATED BLADE 2.86 ST (ELECTRODE) ×1 IMPLANT
ELECT EMG 18 NIMS (NEUROSURGERY SUPPLIES) ×1
ELECT REM PT RETURN 9FT ADLT (ELECTROSURGICAL) ×1
ELECTRODE EMG 18 NIMS (NEUROSURGERY SUPPLIES) ×1 IMPLANT
ELECTRODE REM PT RTRN 9FT ADLT (ELECTROSURGICAL) ×1 IMPLANT
FORCEPS BIPOLAR SPETZLER 8 1.0 (NEUROSURGERY SUPPLIES) ×1 IMPLANT
GAUZE 4X4 16PLY ~~LOC~~+RFID DBL (SPONGE) ×1 IMPLANT
GAUZE SPONGE 4X4 12PLY STRL (GAUZE/BANDAGES/DRESSINGS) ×1 IMPLANT
GENERATOR PULSE INSPIRE (Generator) ×1 IMPLANT
GENERATOR PULSE INSPIRE IV (Generator) ×1 IMPLANT
GLOVE BIO SURGEON STRL SZ 6.5 (GLOVE) IMPLANT
GLOVE BIOGEL M 7.0 STRL (GLOVE) ×1 IMPLANT
GOWN STRL REUS W/ TWL LRG LVL3 (GOWN DISPOSABLE) ×3 IMPLANT
GOWN STRL REUS W/TWL LRG LVL3 (GOWN DISPOSABLE) ×4
IV CATH 18G SAFETY (IV SOLUTION) ×1 IMPLANT
KIT NEURO ACCESSORY W/WRENCH (MISCELLANEOUS) IMPLANT
LEAD SENSING RESP INSPIRE (Lead) ×1 IMPLANT
LEAD SENSING RESP INSPIRE IV (Lead) ×1 IMPLANT
LEAD SLEEP STIM INSPIRE IV/V (Lead) ×1 IMPLANT
LEAD SLEEP STIMULATION INSPIRE (Lead) ×1 IMPLANT
LOOP VASCULAR MINI 18 RED (MISCELLANEOUS) ×1
LOOP VESSEL MAXI BLUE (MISCELLANEOUS) ×1 IMPLANT
MARKER SKIN DUAL TIP RULER LAB (MISCELLANEOUS) ×1 IMPLANT
NDL HYPO 25X1 1.5 SAFETY (NEEDLE) ×1 IMPLANT
NEEDLE HYPO 25X1 1.5 SAFETY (NEEDLE) ×1 IMPLANT
NS IRRIG 1000ML POUR BTL (IV SOLUTION) ×1 IMPLANT
PACK BASIN DAY SURGERY FS (CUSTOM PROCEDURE TRAY) ×1 IMPLANT
PACK ENT DAY SURGERY (CUSTOM PROCEDURE TRAY) ×1 IMPLANT
PASSER CATH 36 CODMAN DISP (NEUROSURGERY SUPPLIES) IMPLANT
PASSER CATH 38CM DISP (INSTRUMENTS) IMPLANT
PENCIL SMOKE EVACUATOR (MISCELLANEOUS) ×1 IMPLANT
PROBE NERVE STIMULATOR (NEUROSURGERY SUPPLIES) ×1 IMPLANT
REMOTE CONTROL SLEEP INSPIRE (MISCELLANEOUS) ×1 IMPLANT
SET WALTER ACTIVATION W/DRAPE (SET/KITS/TRAYS/PACK) ×1 IMPLANT
SLEEVE SCD COMPRESS KNEE MED (STOCKING) ×1 IMPLANT
SPIKE FLUID TRANSFER (MISCELLANEOUS) ×1 IMPLANT
SPONGE INTESTINAL PEANUT (DISPOSABLE) ×1 IMPLANT
STAPLER VISISTAT 35W (STAPLE) ×1 IMPLANT
SUT SILK 2 0 SH (SUTURE) ×1 IMPLANT
SUT SILK 3 0 RB1 (SUTURE) IMPLANT
SUT SILK 3 0 REEL (SUTURE) ×1 IMPLANT
SUT SILK 3 0 SH 30 (SUTURE) IMPLANT
SUT SILK 3-0 (SUTURE) ×2
SUT SILK 3-0 RB1 30XBRD (SUTURE) ×2
SUT VIC AB 3-0 SH 27 (SUTURE) ×1
SUT VIC AB 3-0 SH 27X BRD (SUTURE) ×1 IMPLANT
SUT VIC AB 4-0 PS2 27 (SUTURE) ×2 IMPLANT
SUT VIC AB 5-0 P-3 18X BRD (SUTURE) IMPLANT
SUT VIC AB 5-0 P3 18 (SUTURE)
SUTURE SILK 3-0 RB1 30XBRD (SUTURE) ×1 IMPLANT
SYR 10ML LL (SYRINGE) ×1 IMPLANT
SYR BULB EAR ULCER 3OZ GRN STR (SYRINGE) ×1 IMPLANT
TOWEL GREEN STERILE FF (TOWEL DISPOSABLE) ×2 IMPLANT
VASCULAR TIE MINI RED 18IN STL (MISCELLANEOUS) ×1 IMPLANT

## 2022-02-28 NOTE — Transfer of Care (Signed)
Immediate Anesthesia Transfer of Care Note  Patient: Fred Thompson  Procedure(s) Performed: IMPLANTATION OF HYPOGLOSSAL NERVE STIMULATOR (Right: Chest)  Patient Location: PACU  Anesthesia Type:General  Level of Consciousness: drowsy and patient cooperative  Airway & Oxygen Therapy: Patient Spontanous Breathing and Patient connected to face mask oxygen  Post-op Assessment: Report given to RN and Post -op Vital signs reviewed and stable  Post vital signs: Reviewed and stable  Last Vitals:  Vitals Value Taken Time  BP 145/97 02/28/22 1123  Temp    Pulse 70 02/28/22 1124  Resp 11 02/28/22 1124  SpO2 98 % 02/28/22 1124  Vitals shown include unvalidated device data.  Last Pain:  Vitals:   02/28/22 0807  TempSrc: Oral  PainSc: 0-No pain         Complications: No notable events documented.

## 2022-02-28 NOTE — Anesthesia Postprocedure Evaluation (Signed)
Anesthesia Post Note  Patient: Fred Thompson  Procedure(s) Performed: IMPLANTATION OF HYPOGLOSSAL NERVE STIMULATOR (Right: Chest)     Patient location during evaluation: PACU Anesthesia Type: General Level of consciousness: awake and alert Pain management: pain level controlled Vital Signs Assessment: post-procedure vital signs reviewed and stable Respiratory status: spontaneous breathing, nonlabored ventilation and respiratory function stable Cardiovascular status: blood pressure returned to baseline and stable Postop Assessment: no apparent nausea or vomiting Anesthetic complications: no   No notable events documented.  Last Vitals:  Vitals:   02/28/22 1235 02/28/22 1252  BP:  121/76  Pulse: 62 63  Resp:  18  Temp:  (!) 36.4 C  SpO2: 94% 96%    Last Pain:  Vitals:   02/28/22 1252  TempSrc: Temporal  PainSc: 3                  Lucretia Kern

## 2022-02-28 NOTE — Anesthesia Preprocedure Evaluation (Signed)
Anesthesia Evaluation  Patient identified by MRN, date of birth, ID band Patient awake    Reviewed: Allergy & Precautions, NPO status , Patient's Chart, lab work & pertinent test results  History of Anesthesia Complications Negative for: history of anesthetic complications  Airway Mallampati: II  TM Distance: >3 FB Neck ROM: Full    Dental  (+) Dental Advisory Given, Teeth Intact   Pulmonary sleep apnea and Continuous Positive Airway Pressure Ventilation    Pulmonary exam normal        Cardiovascular Normal cardiovascular exam+ dysrhythmias (s/p ablation 2018) Atrial Fibrillation    Echo 09/12/21: EF 60%, no significant valvular abnormality   Neuro/Psych negative neurological ROS     GI/Hepatic negative GI ROS, Neg liver ROS,,,  Endo/Other  negative endocrine ROS    Renal/GU negative Renal ROS  negative genitourinary   Musculoskeletal  (+) Arthritis ,    Abdominal   Peds  Hematology negative hematology ROS (+)   Anesthesia Other Findings   Reproductive/Obstetrics                             Anesthesia Physical Anesthesia Plan  ASA: 2  Anesthesia Plan: General   Post-op Pain Management: Tylenol PO (pre-op)* and Toradol IV (intra-op)*   Induction: Intravenous  PONV Risk Score and Plan: 2 and Ondansetron, Dexamethasone, Treatment may vary due to age or medical condition and Midazolam  Airway Management Planned: Oral ETT  Additional Equipment: None  Intra-op Plan:   Post-operative Plan: Extubation in OR  Informed Consent: I have reviewed the patients History and Physical, chart, labs and discussed the procedure including the risks, benefits and alternatives for the proposed anesthesia with the patient or authorized representative who has indicated his/her understanding and acceptance.     Dental advisory given  Plan Discussed with:   Anesthesia Plan Comments:          Anesthesia Quick Evaluation

## 2022-02-28 NOTE — Discharge Instructions (Addendum)
  Post Anesthesia Home Care Instructions  Activity: Get plenty of rest for the remainder of the day. A responsible individual must stay with you for 24 hours following the procedure.  For the next 24 hours, DO NOT: -Drive a car -Advertising copywriter -Drink alcoholic beverages -Take any medication unless instructed by your physician -Make any legal decisions or sign important papers.  Meals: Start with liquid foods such as gelatin or soup. Progress to regular foods as tolerated. Avoid greasy, spicy, heavy foods. If nausea and/or vomiting occur, drink only clear liquids until the nausea and/or vomiting subsides. Call your physician if vomiting continues.  Special Instructions/Symptoms: Your throat may feel dry or sore from the anesthesia or the breathing tube placed in your throat during surgery. If this causes discomfort, gargle with warm salt water. The discomfort should disappear within 24 hours.  If you had a scopolamine patch placed behind your ear for the management of post- operative nausea and/or vomiting:  1. The medication in the patch is effective for 72 hours, after which it should be removed.  Wrap patch in a tissue and discard in the trash. Wash hands thoroughly with soap and water. 2. You may remove the patch earlier than 72 hours if you experience unpleasant side effects which may include dry mouth, dizziness or visual disturbances. 3. Avoid touching the patch. Wash your hands with soap and water after contact with the patch.    *May have Tylenol today after 2pm 02/28/2022  Oxycodone 5mg  given at 1pm today

## 2022-02-28 NOTE — H&P (Signed)
Fred Thompson is an 61 y.o. male.   Chief Complaint: Obstructive sleep apnea HPI: Patient with a history of moderately severe obstructive sleep apnea, unable to tolerate CPAP for long-term management of apnea.  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 on diltiazem   Hemorrhoids 01/20/2016   LV dysfunction    a. echo 12/2015: EF 45-50%, mild global HK, normal diastolic function, mildly dilated LA, trace MR, trace TR, unable to estimate PASP   PAF (paroxysmal atrial fibrillation) ([place])    a. diagnosed in 12/2015; b. failed flecainide and multaq; c. CHADS2VASc => 1 (CHF); d. on xarelto; e. s/p successful Afib ablation 09/29/2016   Paroxysmal atrial flutter ([place]) 01/02/2016   ablation 2018   PVC (premature ventricular contraction)    Sleep apnea    uses CPAP nightly    Past Surgical History:  Procedure Laterality Date   APPENDECTOMY  1973   ATRIAL FIBRILLATION ABLATION N/A 09/29/2016   Procedure: Atrial Fibrillation Ablation;  Surgeon: Constance Haw, MD;  Location: [facility];  Service: Cardiovascular;  Laterality: N/A;   CATARACT EXTRACTION Right 09/2017   has had infection, then oil bubble, the gas bubble 02/06/2018   DRUG INDUCED ENDOSCOPY N/A 10/25/2021   Procedure: DRUG INDUCED SLEEP ENDOSCOPY;  Surgeon: Jerrell Belfast, MD;  Location: [facility];  Service: ENT;  Laterality: N/A;   KNEE SURGERY Right 1999   arthroscopic   NM RENAL LASIX ([facility]) Bilateral 2007   TRANSANAL HEMORRHOIDAL DEARTERIALIZATION N/A 07/08/2019   Procedure: TRANSANAL HEMORRHOIDAL DEARTERIALIZATION;  Surgeon: Leighton Ruff, MD;  Location: [facility];  Service: General;  Laterality: N/A;    Family History  Problem Relation Age of Onset   CAD Father        quad bypass @ 78   Healthy Sister     Social History:  reports that he has never smoked. He has never used smokeless tobacco. He reports current alcohol use. He reports that he does not use drugs.  Allergies:  Allergies  Allergen Reactions   Atorvastatin Other (See Comments)    Fatigue, myalgias Other reaction(s): Other (See Comments) Fatigue, myalgias    Medications Prior to Admission  Medication Sig Dispense Refill   cholecalciferol (VITAMIN D3) 25 MCG (1000 UNIT) tablet Take 1,000 Units by mouth daily.     diltiazem (TIADYLT ER) 180 MG 24 hr capsule Take 1 capsule (180 mg total) by mouth daily. 90 capsule 3   dorzolamide-timolol (COSOPT) 22.3-6.8 MG/ML ophthalmic solution Place 2 drops into the right eye daily.     finasteride (PROSCAR) 5 MG tablet Take 5 mg by mouth daily.     rosuvastatin (CRESTOR) 40 MG tablet Take 1 tablet (40 mg total) by mouth daily. 90 tablet 3   vitamin E 400 UNIT capsule Take 400 Units by mouth daily.     meloxicam (MOBIC) 15 MG tablet Take 15 mg by mouth as needed.      No results found for this or any previous visit (from the past 48 hour(s)). No results found.  Review of Systems  Respiratory:  Positive for apnea.     Blood pressure (!) 129/93, pulse 65, temperature 97.6 F (36.4 C), temperature source Oral, resp. rate 17, height 6\' 5"  (1.956 m), weight 104.1 kg, SpO2 95 %. Physical Exam Constitutional:  Appearance: Normal appearance.  Cardiovascular:     Rate and Rhythm: Normal rate.  Pulmonary:     Effort: Pulmonary effort is normal.  Musculoskeletal:     Cervical back: Normal range of motion.  Neurological:     Mental Status: He is alert.      Assessment/Plan Patient admitted for outpatient surgery under general anesthesia: Hypoglossal nerve stimulator implant-Inspire Implant.  Osborn Coho, MD 02/28/2022, 8:14 AM

## 2022-02-28 NOTE — Anesthesia Procedure Notes (Signed)
Procedure Name: Intubation Date/Time: 02/28/2022 8:46 AM  Performed by: Jaemarie Hochberg, Ernesta Amble, CRNAPre-anesthesia Checklist: Patient identified, Emergency Drugs available, Suction available and Patient being monitored Patient Re-evaluated:Patient Re-evaluated prior to induction Oxygen Delivery Method: Circle system utilized Preoxygenation: Pre-oxygenation with 100% oxygen Induction Type: IV induction Ventilation: Mask ventilation without difficulty Laryngoscope Size: Mac and 3 Grade View: Grade II Tube type: Oral Tube size: 7.5 mm Number of attempts: 1 Airway Equipment and Method: Stylet and Oral airway Placement Confirmation: ETT inserted through vocal cords under direct vision, positive ETCO2 and breath sounds checked- equal and bilateral Secured at: 22 cm Tube secured with: Tape Dental Injury: Teeth and Oropharynx as per pre-operative assessment

## 2022-02-28 NOTE — Op Note (Signed)
Operative Note: INSPIRE IMPLANT  Patient: Fred Thompson  Medical record number: [id number]  Date:02/28/2022  Pre-operative Indications: Moderate/severe obstructive sleep apnea with positive airway pressure intolerance  Postoperative Indications: Same  Surgical Procedure:  1.  12th cranial nerve (hypoglossal) stimulation implant    2.  Placement of chest wall respiratory sensor    3.  Electronic analysis of implanted neurostimulator with pulse generator system  Anesthesia: GET  Surgeon: Barbee Cough, M.D.  Assist: Aquilla Hacker, PA  BMI: 27.2 kg/m  Signs and symptoms of the patient's obstructive sleep apnea include poor sleep quality, daytime fatigue and hypersomnolence and inability to tolerate CPAP.  Indications for hypoglossal nerve stimulator are significant obstructive sleep apnea with inability to tolerate medical treatment.  Complications: None  EBL: Less than 100 cc   Brief History: The patient is a 61 y.o. male with a history of moderate/severe obstructive sleep apnea.  The patient has undergone work-up including sleep study and trial of CPAP which they did not tolerate.  Patient is unable to use long-term CPAP for control of obstructive sleep apnea.  Patient underwent DISE which showed anterior to posterior airway obstruction, considered a good candidate for Inspire Implant. Given the patient's history and findings, I recommended Inspire Implant under general anesthesia, risks and benefits were discussed in detail with the patient and family. They understand and agree with our plan for surgery which is scheduled at [facility] on an elective basis.  Surgical Procedure: The patient is brought to the operating room on 02/28/2022 and placed in supine position on the operating table. General endotracheal anesthesia was established without difficulty. When the patient was adequately anesthetized, surgical timeout was performed and correct identification of the patient and  the surgical procedure. The patient was injected with 5 cc of 1% lidocaine 1:100,000 dilution epinephrine in subcutaneous fashion in the proposed skin incisions.  The patient was positioned and prepped and draped in sterile fashion.  The NIMS monitoring system was positioned and electrodes were placed in the anterior floor of mouth and tongue to assess the genioglossus and styloglossus muscle groups.  Nerve monitoring was used throughout the surgical procedure.  The procedure was begun by creating a modified right submandibular incision in the upper anterior lateral neck ~2 cm below the mandible and in a natural skin crease.  The incision was carried through the skin and underlying subcutaneous tissue to the level of the platysma.  Platysma muscle was divided and subplatysmal flaps were elevated superiorly and inferiorly.  The submandibular space was entered and the submandibular gland was identified and retracted posteriorly.  The digastric tendon was identified and dissection was carried out anterior and posterior along the tendon and muscle bellies.  The mylohyoid muscle was identified and retracted anteriorly and the hypoglossal nerve was carefully dissected across the anterior floor of the submandibular space.  Lateral branches to the retrusor muscles were identified and tested intraoperatively using the NIMS stimulator.  Stimulation electrode cuff for the hypoglossal nerve stimulator was placed distal to these branches on the medial hypoglossal nerve branch innervating the genioglossus muscle.  Diagnostic evaluation confirmed activation of the genioglossus muscle, resulting in genioglossal activation and tongue protrusion which was visually confirmed in the operating room.  The stimulation electrode was  sutured to the digastric tendon with interrupted 4-0 silk sutures.  A second second incision was created in the anterior upper right chest wall overlying the second rib interspace.  Incision was carried  through the skin and underlying subcutaneous tissue to the  level of the pectoralis major muscle.  The IPG pocket was created deep to the subcutaneous layer and superficial to the pectoralis muscle.  Placement of the stimulation lead was then undertaken by gentle dissection through the pectoralis major muscle parallel to the muscle fibers overlying the second rib interspace.  The subpectoral fat plane was then divided bluntly and the medial border of the external intercostal muscle was identified.  1 cm posterior to the medial border, dissection was carried through the external intercostal and a plane was developed in the second rib interspace.  The sensor lead was then tunneled into the second rib interspace and sutured with 3-0 silk suture to secure it in proper orientation with the sensing probe facing the pleural space.  The pectoralis major muscle dissection was then brought back into its normal anatomic position and the sense lead was brought out into the subcutaneous pocket.  The lead for the stimulating electrode was then tunneled in a subplatysmal fashion from the submandibular incision to the anterior chest wall incision.  The stimulating electrode and respiration sensing lead were connected to the implantable pulse generator.  Diagnostic evaluation was run confirming respiratory signal and good tongue protrusion on stimulation.  The implantable pulse generator was then placed in the subclavicular pocket and sutured to the pectoralis fascia with 2-0 silk sutures sutures.  The incisions were thoroughly irrigated with bacitracin saline irrigation.  The incisions were then closed in multiple layers with 4-0 and 5-0 Vicryl interrupted sutures in the deep and superficial subcutaneous levels at each incision site.  Dermabond surgical glue was used for skin closure.  The patient's incisions were dressed with rolled gauze and Hypafix tape for pressure dressing.    An orogastric tube was passed and stomach  contents were aspirated. Patient was awakened from anesthetic and transferred from the operating room to the recovery room in stable condition. There were no complications and blood loss was minimal.  X-rays were obtained in the recovery room to assess the proper location of the pulse generator, sensor lead and stimulation lead.   Barbee Cough, M.D. [facility] 02/28/2022

## 2022-03-01 ENCOUNTER — Encounter (HOSPITAL_BASED_OUTPATIENT_CLINIC_OR_DEPARTMENT_OTHER): Payer: Self-pay | Admitting: Otolaryngology

## 2022-03-07 ENCOUNTER — Telehealth: Payer: BC Managed Care – PPO | Admitting: Adult Health

## 2022-03-07 DIAGNOSIS — G4733 Obstructive sleep apnea (adult) (pediatric): Secondary | ICD-10-CM | POA: Diagnosis not present

## 2022-04-04 DIAGNOSIS — Z86010 Personal history of colonic polyps: Secondary | ICD-10-CM | POA: Diagnosis not present

## 2022-04-04 DIAGNOSIS — D122 Benign neoplasm of ascending colon: Secondary | ICD-10-CM | POA: Diagnosis not present

## 2022-04-04 DIAGNOSIS — K5730 Diverticulosis of large intestine without perforation or abscess without bleeding: Secondary | ICD-10-CM | POA: Diagnosis not present

## 2022-04-04 DIAGNOSIS — K648 Other hemorrhoids: Secondary | ICD-10-CM | POA: Diagnosis not present

## 2022-04-07 DIAGNOSIS — G4733 Obstructive sleep apnea (adult) (pediatric): Secondary | ICD-10-CM | POA: Diagnosis not present

## 2022-04-18 ENCOUNTER — Ambulatory Visit (INDEPENDENT_AMBULATORY_CARE_PROVIDER_SITE_OTHER): Payer: BC Managed Care – PPO | Admitting: Neurology

## 2022-04-18 ENCOUNTER — Encounter: Payer: Self-pay | Admitting: Neurology

## 2022-04-18 VITALS — Ht 77.0 in | Wt 229.0 lb

## 2022-04-18 DIAGNOSIS — Z9682 Presence of neurostimulator: Secondary | ICD-10-CM | POA: Diagnosis not present

## 2022-04-18 NOTE — Progress Notes (Signed)
SLEEP MEDICINE CLINIC   Provider:  Larey Seat, [place] D  Primary Care Physician:  Lujean Amel, MD [facility].    Referring Provider: Dr Fred Thompson.   HPI: Rv after INSPIRE - 04-18-2022. Dear Dr. Einar Thompson,   [name] is a meanwhile 62 year-old caucasian mutual patient . He presented upon your referral in 07-2016  with cardiac condition and irregular heartbeat with rapid ventricular responses . He tested positive for OSA , as atrial fib often is correlated to the presence of sleep apnea, also unbeknownst to him.  HE HAD a severe degree of apnea and we initiated PAP therapy in 2018. Also he has tolerated CPAP for years,  he was interested in [place] .  He was referred by NP Fred Thompson. He saw Dr Fred Thompson in 12-2021 and underwent implantation 02-28-2022. We saw him here today for  activation on 04-18-2022  His tongue moved in midline and he has shown good understanding of using the remote controls.   He showed tongue sensation at 0.3  V and tongue movement at 0.4  V.    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        Fred Thompson is a 62 y.o. male , seen here as in a referral  from Dr. Einar Thompson to to follow up on a HST , 03-05-2017. I have the pleasure of meeting with [name] today following up on his home sleep test, the patient who had endorsed the Epworth Sleepiness Scale at 15 points prior to his home sleep test now endorses the Epworth score at 6 points.  The home sleep test was performed on 18 September 2016, it recorded 7 hours and 41 minutes activity.  He had a really mild apnea index of 5.6 but his respiratory disturbance index was 9.2 indicating that he was snoring.  The total desaturation time was only 1 minute, heart rate varied greatly between 40 and 107 bpm I recommended CPAP patient is a optimization. The patient then was followed by a in lab titration on 25 October 2016 and did excellent at 12 cmH2O was 2 cm EPR he was fitted, with a ResMed  air touch fullface mask enlarged.  He also did have some periodic limb movement in moderate degree and we did not see atrial flutter while he was under CPAP treatment.  He is now here with a compliance download for the last 30 days which is excellent 97% compliance 7 hours and 33 minutes of average use of time, onset pressure 1230 be devoid of a 3 cm EPR residual AHI is 2.1.  Based on this I would not need to make any changes and I can clearly see that the patient benefited from CPAP therapy given that his Epworth score has been reduced by more than 50%.He remains on diltiazem for rate control, had an ablation , and has worn a monitor for one weekend (48 hours) negative for a fib.  Side benefit: he has no nocturia any longer ! Used [place] 3 times at night.     I have the pleasure of seeing [name] today on 08/09/2016, referred by his cardiologist with an underlying diagnosis of atrial fibrillation and atrial flutter.  The patient reports snoring, rhinitis and has not been known to have apnea.  The patient has a past medical history of Bell's palsy on the left, had frequent PVCs, an echocardiogram from 01/18/2016 showed normal left ventricle size mild decrease in systolic function and  mild global hypokinesis. Ejection fraction is 45-50% with a normal diastolic filling pattern the left atrium is mildly dilated. He has a trace of mitral regurgitation and a trace of tricuspid regurgitation as well. Paroxysmal atrial flutter was diagnosed in October 2017 and followed up with an exercise tolerance stress test on 05/15/2016, he achieved only 82% of PMH. At the time of the exercise tolerance stress test the patient had been taken generic Lipitor and he developed myositis, aching and was unable to exercise. He states that he was even unable to shrug his shoulders or walk a flight of stairs. There were  PVCs bigemini's-  with stress he had been clearly in atrial fibrillation in November 2017, when he was seen  by Dr. Crissie Thompson. He has an incomplete right bundle branch block. Paroxysmal episodes of atrial flutter with variable atrioventricular conduction and rapid ventricular response with a diagnosis. The follow-up stress test was done as a nuclear study and reportedly normal.    Chief complaint according to patient : The patient reported that he feels tired and fatigued, and attributes this mainly to the heart medication rather than the irregular heart rhythm. He also has nocturia up to 3 times at night.  Sleep habits are as follows: The patient goes to bed by 9:30 PM, his wife prefers to watch the TV on but he turns the volume down. It seems not to affect his sleep that the TV is in the room. The bedroom is otherwise cool and quiet. He sleeps on his side. He sleeps on one pillow only. He uses a flat mattress top. His sleep will be interrupted 2-3 times at night by the urge to urinate. He rises at 5:00 AM. He has an over 1 hour commute every morning to [place], [place]. He denies any sleep choking, shortness of breath, diaphoresis or palpitations. He wakes without headaches. He feels usually not ready to start his day and not restored or refreshed but has always done so. In total he will gets about 6- 7 hours of sleep at night.  Sleep medical history and family sleep history:  No family History of OSA, he was a sleep walker.   Social history: The patient is married with a 59 year old daughter, 2 grandchildren.  Lives in [place]. He commutes long distance. Not a shift worker.  He has no history of tobacco use, he drinks less than 3 alcoholic beverages per week. He does not drink coffee tea and he does not drink soft drinks. No caffeine intake. Manages [employer].   Review of Systems: Out of a complete 14 system review, the patient complains of only the following symptoms, and all other reviewed systems are negative. How likely are you to doze in the following situations: 0 = not  likely, 1 = slight chance, 2 = moderate chance, 3 = high chance.  How likely are you to doze in the following situations: 0 = not likely, 1 = slight chance, 2 = moderate chance, 3 = high chance  Sitting and Reading? Watching Television? Sitting inactive in a public place (theater or meeting)? Lying down in the afternoon when circumstances permit? Sitting and talking to someone? Sitting quietly after lunch without alcohol? In a car, while stopped for a few minutes in traffic? As a passenger in a car for an hour without a break?  Total = 9 and FSS 25.     Epworth score was 6 on CPAP from 15- pre CPAP  , Fatigue severity score  60  , depression score 1/ 15   Social History   Socioeconomic History   Marital status: Married    Spouse name: Not on file   Number of children: 1   Years of education: Not on file   Highest education level: Not on file  Occupational History   Not on file  Tobacco Use   Smoking status: Never   Smokeless tobacco: Never  Vaping Use   Vaping Use: Never used  Substance and Sexual Activity   Alcohol use: Yes    Comment: occasional   Drug use: Never   Sexual activity: Yes  Other Topics Concern   Not on file  Social History Narrative   Not on file   Social Determinants of Health   Financial Resource Strain: Not on file  Food Insecurity: Not on file  Transportation Needs: Not on file  Physical Activity: Not on file  Stress: Not on file  Social Connections: Not on file  Intimate Partner Violence: Not on file    Family History  Problem Relation Age of Onset   CAD Father        quad bypass @ 67   Healthy Sister     Past Medical History:  Diagnosis Date   Allergic rhinitis 01/20/2016   Arthritis    BPH (benign prostatic hyperplasia) 01/01/2016   Dyslipidemia (high LDL; low HDL) 01/01/2016   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 on diltiazem   Hemorrhoids 01/20/2016   LV dysfunction    a. echo 12/2015: EF 45-50%, mild global HK, normal  diastolic function, mildly dilated LA, trace MR, trace TR, unable to estimate PASP   PAF (paroxysmal atrial fibrillation) ([place])    a. diagnosed in 12/2015; b. failed flecainide and multaq; c. CHADS2VASc => 1 (CHF); d. on xarelto; e. s/p successful Afib ablation 09/29/2016   Paroxysmal atrial flutter ([place]) 01/02/2016   ablation 2018   PVC (premature ventricular contraction)    Sleep apnea    uses CPAP nightly    Past Surgical History:  Procedure Laterality Date   APPENDECTOMY  1973   ATRIAL FIBRILLATION ABLATION N/A 09/29/2016   Procedure: Atrial Fibrillation Ablation;  Surgeon: Constance Haw, MD;  Location: [facility];  Service: Cardiovascular;  Laterality: N/A;   CATARACT EXTRACTION Right 09/2017   has had infection, then oil bubble, the gas bubble 02/06/2018   DRUG INDUCED ENDOSCOPY N/A 10/25/2021   Procedure: DRUG INDUCED SLEEP ENDOSCOPY;  Surgeon: Jerrell Belfast, MD;  Location: [facility];  Service: ENT;  Laterality: N/A;   IMPLANTATION OF HYPOGLOSSAL NERVE STIMULATOR Right 02/28/2022   Procedure: IMPLANTATION OF HYPOGLOSSAL NERVE STIMULATOR;  Surgeon: Jerrell Belfast, MD;  Location: [facility];  Service: ENT;  Laterality: Right;   KNEE SURGERY Right 1999   arthroscopic   NM RENAL LASIX ([place] HX) Bilateral 2007   TRANSANAL HEMORRHOIDAL DEARTERIALIZATION N/A 07/08/2019   Procedure: TRANSANAL HEMORRHOIDAL DEARTERIALIZATION;  Surgeon: Leighton Ruff, MD;  Location: [facility];  Service: General;  Laterality: N/A;    Current Outpatient Medications  Medication Sig Dispense Refill   cholecalciferol (VITAMIN D3) 25 MCG (1000 UNIT) tablet Take 1,000 Units by mouth daily.     diltiazem (TIADYLT ER) 180 MG 24 hr capsule Take 1 capsule (180 mg total) by mouth daily. 90 capsule 3   dorzolamide-timolol (COSOPT) 22.3-6.8 MG/ML ophthalmic solution Place 2 drops into the right eye daily.     finasteride (PROSCAR) 5 MG tablet Take 5 mg by  mouth daily.     HYDROcodone-acetaminophen (NORCO) 5-325 MG tablet 1 to 2 tablets every 12 hours as needed for postoperative pain.  May alternate ibuprofen 600 mg every 6 hours. 20 tablet 0   meloxicam (MOBIC) 15 MG tablet Take 15 mg by mouth as needed.     rosuvastatin (CRESTOR) 40 MG tablet Take 1 tablet (40 mg total) by mouth daily. 90 tablet 3   vitamin E 400 UNIT capsule Take 400 Units by mouth daily.     No current facility-administered medications for this visit.    Allergies as of 04/18/2022 - Review Complete 02/28/2022  Allergen Reaction Noted   Atorvastatin Other (See Comments) 07/12/2010    Vitals: Ht 6\' 5"  (1.956 m)   Wt 229 lb (103.9 kg)   BMI 27.16 kg/m  Last Weight:  Wt Readings from Last 1 Encounters:  04/18/22 229 lb (103.9 kg)   04/20/22 mass index is 27.16 kg/m.     Last Height:   Ht Readings from Last 1 Encounters:  04/18/22 6\' 5"  (1.956 m)    Assessment:  INSPIRE DEVICE     Dear Dr. 04/20/22,  After physical and neurologic examination, review of laboratory studies, testing and pre-existing records as far as provided in visit, my assessment is   1) Mr. Ducey presented upon your referral in 07-2016  with cardiac condition and irregular heartbeat with rapid ventricular responses . He tested positive for OSA , atrial fib often is correlated to the presence of sleep apnea, also unbeknownst to him. HE HAD a severe degree of apnea and we initiated PAP therapy in 2018. also he has tolerated CPAP for years,  he was interested in [facility] . He was referred by NP Fred Thompson. He saw Dr 06-06-1991 in 12-2021 and underwent implantation 02-28-2022. We saw him here today for  activation on 04-18-2022  His tongue moved in midline and he has shown good understanding of using the remote controls.   He showed tongue sensation at 0.3  V and tongue movement at 0.4  V.     I spent more than 25 minutes of face to face time with the patient. Greater than 50% of time was spent in  counseling and coordination of care. We have discussed the diagnosis and differential and I answered the patient's questions.    Plan:  Treatment plan and additional workup :   Functional amplitude was seen at 0.3 V at upper limits at 1.3 valgus set today, stopped delay of 17 minutes percent, a pause time of 15 minutes was set, a total therapy duration of 8 hours..  Electronic electrode setting is plus minus plus.  Pulse width is 90 s with a rate of 33 Hz.  Will be discharged with the ability to step up voltage every Tuesday.    He will have a readiness appointment on 07-11-2022 at 11:30 AM,  He will  have a sleep study with titration to sleep to inspire on 4-30 at 9 PM  He will have a fine-tuning appointment on 07-25-22 at 11 AM.   He was asked to bring his remote control to all of his appointments.   01-26-1997, MD 04/18/2022, 11:46 AM  Certified in Neurology by ABPN Certified in Sleep Medicine by [facility] [address] [place], [address] [place]

## 2022-04-27 DIAGNOSIS — R059 Cough, unspecified: Secondary | ICD-10-CM | POA: Diagnosis not present

## 2022-04-27 DIAGNOSIS — J029 Acute pharyngitis, unspecified: Secondary | ICD-10-CM | POA: Diagnosis not present

## 2022-05-08 DIAGNOSIS — G4733 Obstructive sleep apnea (adult) (pediatric): Secondary | ICD-10-CM | POA: Diagnosis not present

## 2022-05-10 ENCOUNTER — Ambulatory Visit: Payer: BC Managed Care – PPO | Admitting: Cardiology

## 2022-05-30 ENCOUNTER — Telehealth: Payer: Self-pay | Admitting: Neurology

## 2022-05-30 NOTE — Telephone Encounter (Signed)
Updated [employer] auth>  [employer]- [employer] no auth req via fax form.  Patient is scheduled at [facility] for 07/18/22 at 9 pm.

## 2022-05-30 NOTE — Telephone Encounter (Signed)
[employer] pending faxed form   Patient [employer] is scheduled for 07/18/22 at 9 pm.

## 2022-06-05 DIAGNOSIS — L821 Other seborrheic keratosis: Secondary | ICD-10-CM | POA: Diagnosis not present

## 2022-06-05 DIAGNOSIS — C4401 Basal cell carcinoma of skin of lip: Secondary | ICD-10-CM | POA: Diagnosis not present

## 2022-06-05 DIAGNOSIS — L814 Other melanin hyperpigmentation: Secondary | ICD-10-CM | POA: Diagnosis not present

## 2022-06-05 DIAGNOSIS — Z129 Encounter for screening for malignant neoplasm, site unspecified: Secondary | ICD-10-CM | POA: Diagnosis not present

## 2022-06-05 DIAGNOSIS — D485 Neoplasm of uncertain behavior of skin: Secondary | ICD-10-CM | POA: Diagnosis not present

## 2022-06-05 DIAGNOSIS — L570 Actinic keratosis: Secondary | ICD-10-CM | POA: Diagnosis not present

## 2022-06-05 DIAGNOSIS — C44519 Basal cell carcinoma of skin of other part of trunk: Secondary | ICD-10-CM | POA: Diagnosis not present

## 2022-06-05 DIAGNOSIS — D225 Melanocytic nevi of trunk: Secondary | ICD-10-CM | POA: Diagnosis not present

## 2022-06-15 ENCOUNTER — Other Ambulatory Visit: Payer: Self-pay | Admitting: Gastroenterology

## 2022-07-04 ENCOUNTER — Telehealth: Payer: Self-pay | Admitting: Neurology

## 2022-07-04 NOTE — Telephone Encounter (Signed)
LVM for patient to call me back. Have an available spot for him on 4/28, if he is interested. Waiting for patient to return my call.

## 2022-07-04 NOTE — Telephone Encounter (Signed)
Fred Thompson, this is an inspire pt and he is needing to reschedule his sleep study scheduled for 07/18/22. Thank you!

## 2022-07-11 ENCOUNTER — Encounter (HOSPITAL_COMMUNITY): Payer: Self-pay | Admitting: Gastroenterology

## 2022-07-11 ENCOUNTER — Encounter: Payer: Self-pay | Admitting: Neurology

## 2022-07-11 ENCOUNTER — Telehealth: Payer: Self-pay | Admitting: Neurology

## 2022-07-11 ENCOUNTER — Ambulatory Visit (INDEPENDENT_AMBULATORY_CARE_PROVIDER_SITE_OTHER): Payer: BC Managed Care – PPO | Admitting: Neurology

## 2022-07-11 VITALS — Ht 77.0 in | Wt 222.0 lb

## 2022-07-11 DIAGNOSIS — G4733 Obstructive sleep apnea (adult) (pediatric): Secondary | ICD-10-CM | POA: Diagnosis not present

## 2022-07-11 DIAGNOSIS — Z9682 Presence of neurostimulator: Secondary | ICD-10-CM | POA: Diagnosis not present

## 2022-07-11 NOTE — Progress Notes (Signed)
Attempted to obtain medical history via telephone, unable to reach at this time. HIPAA compliant voicemail message left requesting return call to pre surgical testing department. 

## 2022-07-11 NOTE — Telephone Encounter (Signed)
Patient was an inspire follow up today.  Right now his SS is scheduled for 09/12/2022 at 9 pm and his follow up with Dr. Vickey Huger is 09/18/2022. If we get a cancellation he then may have a sooner SS.   Please see below for his inspire information from today's visit.

## 2022-07-11 NOTE — Progress Notes (Signed)
Provider:  Melvyn Novas, MD  Primary Care Physician:  [name], MD [address] [place] [address]     Referring Provider: [name], Md [address] [place],  [place] [address]          Chief Complaint according to patient   Patient presents with:     New Patient (Initial Visit)          Referring Provider: Dr Jacinto Halim.     HISTORY OF PRESENT ILLNESS:  Fred Thompson is a 62 y.o. male patient who is here for revisit 07/11/2022   Chief concern according to patient :  none   HPI: I have the pleasure of seeing Fred Thompson. Kurtenbach today on 11 July 2022.  The patient endorsed the Epworth Sleepiness Scale today at 5 points and the fatigue severity score was not endorsed.  His inspire compliance has been high he uses the machine on average 7-1/2 hours at night 99% of all nights reviewed where with this good compliance.  Since 14 April he has been using inspire at a charge of 1.4 V output current.  He has few needs to pause the device at night 1 time on average per night.  The amplitude on the patient control was set between 0.4 and 1.4 V pulse width was 90 s rate was 33 Hz start delay was 30 minutes pause time was 15 minutes therapy duration was 8 hours.  Rescheduled for June 25th, PSG for AHI evaluation.      Rv after INSPIRE - 04-18-2022. Dear Dr. Jacinto Halim,   FredFred Thompson is a meanwhile 62 year-old caucasian mutual patient . He presented upon your referral in 07-2016  with cardiac condition and irregular heartbeat with rapid ventricular responses . He tested positive for OSA , as atrial fib often is correlated to the presence of sleep apnea, also unbeknownst to him.  HE HAD a severe degree of apnea and we initiated PAP therapy in 2018. Also he has tolerated CPAP for years,  he was interested in INSPIRE .  He was referred by NP [place]. He saw Dr Annalee Genta in 12-2021 and underwent implantation 02-28-2022. We saw him  here today for  activation on 04-18-2022  His tongue moved in midline and he has shown good understanding of using the remote controls.    He showed tongue sensation at 0.3  V and tongue movement at 0.4  V.      Social history : patient works for [employer] rep.  Fred Thompson is a 62 y.o. male , seen here as in a referral  from Dr. Jacinto Halim to to follow up on a HST , 03-05-2017. I have the pleasure of meeting with [name] today following up on his home sleep test, the patient who had endorsed the Epworth Sleepiness Scale at 15 points prior to his home sleep test now endorses the Epworth score at 6 points.  The home sleep test was performed on 18 September 2016, it recorded 7 hours and 41 minutes activity.  He had a really mild apnea index of 5.6 but his respiratory disturbance index was 9.2 indicating that he was snoring.  The total desaturation time was only 1 minute, heart rate varied greatly between 40 and 107 bpm I recommended CPAP patient is a optimization. The patient then was followed by a in lab titration on 25 October 2016 and did excellent  at 12 cmH2O was 2 cm EPR he was fitted, with a ResMed air touch fullface mask enlarged.  He also did have some periodic limb movement in moderate degree and we did not see atrial flutter while he was under CPAP treatment.  He is now here with a compliance download for the last 30 days which is excellent 97% compliance 7 hours and 33 minutes of average use of time, onset pressure 1230 be devoid of a 3 cm EPR residual AHI is 2.1.  Based on this I would not need to make any changes and I can clearly see that the patient benefited from CPAP therapy given that his Epworth score has been reduced by more than 50%.He remains on diltiazem for rate control, had an ablation , and has worn a monitor for one weekend (48 hours) negative for a fib.  Side benefit: he has no nocturia any longer ! Used [place] 3 times at night.        I have the pleasure of seeing  [name] today on 08/09/2016, referred by his cardiologist with an underlying diagnosis of atrial fibrillation and atrial flutter.  The patient reports snoring, rhinitis and has not been known to have apnea.     Review of Systems: Out of a complete 14 system review, the patient complains of only the following symptoms, and all other reviewed systems are negative.:   Social History   Socioeconomic History   Marital status: Married    Spouse name: Not on file   Number of children: 1   Years of education: Not on file   Highest education level: Not on file  Occupational History   Not on file  Tobacco Use   Smoking status: Never   Smokeless tobacco: Never  Vaping Use   Vaping Use: Never used  Substance and Sexual Activity   Alcohol use: Yes    Comment: occasional   Drug use: Never   Sexual activity: Yes  Other Topics Concern   Not on file  Social History Narrative   Not on file   Social Determinants of Health   Financial Resource Strain: Not on file  Food Insecurity: Not on file  Transportation Needs: Not on file  Physical Activity: Not on file  Stress: Not on file  Social Connections: Not on file    Family History  Problem Relation Age of Onset   CAD Father        quad bypass @ 19   Healthy Sister     Past Medical History:  Diagnosis Date   Allergic rhinitis 01/20/2016   Arthritis    BPH (benign prostatic hyperplasia) 01/01/2016   Dyslipidemia (high LDL; low HDL) 01/01/2016   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 on diltiazem   Hemorrhoids 01/20/2016   LV dysfunction    a. echo 12/2015: EF 45-50%, mild global HK, normal diastolic function, mildly dilated LA, trace MR, trace TR, unable to estimate PASP   PAF (paroxysmal atrial fibrillation)    a. diagnosed in 12/2015; b. failed flecainide and multaq; c. CHADS2VASc => 1 (CHF); d. on xarelto; e. s/p successful Afib ablation 09/29/2016   Paroxysmal atrial flutter 01/02/2016   ablation 2018   PVC (premature  ventricular contraction)    Sleep apnea    uses CPAP nightly    Past Surgical History:  Procedure Laterality Date   APPENDECTOMY  1973   ATRIAL FIBRILLATION ABLATION N/A 09/29/2016   Procedure: Atrial Fibrillation Ablation;  Surgeon: Regan Lemming, MD;  Location: [facility];  Service: Cardiovascular;  Laterality: N/A;   CATARACT EXTRACTION Right 09/2017   has had infection, then oil bubble, the gas bubble 02/06/2018   DRUG INDUCED ENDOSCOPY N/A 10/25/2021   Procedure: DRUG INDUCED SLEEP ENDOSCOPY;  Surgeon: Osborn Coho, MD;  Location: [facility];  Service: ENT;  Laterality: N/A;   IMPLANTATION OF HYPOGLOSSAL NERVE STIMULATOR Right 02/28/2022   Procedure: IMPLANTATION OF HYPOGLOSSAL NERVE STIMULATOR;  Surgeon: Osborn Coho, MD;  Location:  SURGERY CENTER;  Service: ENT;  Laterality: Right;   KNEE SURGERY Right 1999   arthroscopic   NM RENAL LASIX ([facility] HX) Bilateral 2007   TRANSANAL HEMORRHOIDAL DEARTERIALIZATION N/A 07/08/2019   Procedure: TRANSANAL HEMORRHOIDAL DEARTERIALIZATION;  Surgeon: Romie Levee, MD;  Location: [facility];  Service: General;  Laterality: N/A;     Current Outpatient Medications on File Prior to Visit  Medication Sig Dispense Refill   cholecalciferol (VITAMIN D3) 25 MCG (1000 UNIT) tablet Take 1,000 Units by mouth daily.     diltiazem (TIADYLT ER) 180 MG 24 hr capsule Take 1 capsule (180 mg total) by mouth daily. 90 capsule 3   dorzolamide-timolol (COSOPT) 22.3-6.8 MG/ML ophthalmic solution Place 2 drops into the right eye daily.     finasteride (PROSCAR) 5 MG tablet Take 5 mg by mouth daily.     rosuvastatin (CRESTOR) 40 MG tablet Take 1 tablet (40 mg total) by mouth daily. 90 tablet 3   vitamin E 400 UNIT capsule Take 400 Units by mouth daily.     No current facility-administered medications on file prior to visit.    Allergies  Allergen Reactions   Atorvastatin Other (See Comments)     Fatigue, myalgias Other reaction(s): Other (See Comments) Fatigue, myalgias     DIAGNOSTIC DATA (LABS, IMAGING, TESTING) - I reviewed patient records, labs, notes, testing and imaging myself where available.  Lab Results  Component Value Date   WBC 5.6 09/30/2016   HGB 15.6 07/08/2019   HCT 46.0 07/08/2019   MCV 86.1 09/30/2016   PLT 181 09/30/2016      Component Value Date/Time   NA 141 07/08/2019 1250   K 3.9 07/08/2019 1250   CL 105 07/08/2019 1250   CO2 24 09/29/2016 1028   GLUCOSE 90 07/08/2019 1250   BUN 13 07/08/2019 1250   CREATININE 0.90 07/08/2019 1250   CALCIUM 8.7 (L) 09/29/2016 1028   PROT 7.2 04/06/2017 0738   ALBUMIN 4.4 04/06/2017 0738   AST 22 04/06/2017 0738   ALT 26 04/06/2017 0738   ALKPHOS 77 04/06/2017 0738   BILITOT 0.3 04/06/2017 0738   GFRNONAA >60 09/29/2016 1028   GFRAA >60 09/29/2016 1028   Lab Results  Component Value Date   CHOL 140 07/08/2021   HDL 45 07/08/2021   LDLCALC 83 07/08/2021   TRIG 57 07/08/2021   CHOLHDL 3.1 04/06/2017   No results found for: "HGBA1C" No results found for: "VITAMINB12" No results found for: "TSH"  PHYSICAL EXAM:  Today's Vitals   07/11/22 1139  Weight: 222 lb (100.7 kg)  Height:  (1.956 m)   Body mass index is 26.33 kg/m.   Wt Readings from Last 3 Encounters:  07/11/22 222 lb (100.7 kg)  04/18/22 229 lb (103.9 kg)  02/28/22 229 lb 8 oz (104.1 kg)     Ht Readings from Last 3 Encounters:  07/11/22  (1.956 m)  04/18/22  (1.956 m)  02/28/22  (1.956 m)  General: The patient is awake, alert and appears not in acute distress. The patient is well groomed. Head: Normocephalic, atraumatic. Neck is supple.   NEUROLOGIC EXAM: The patient is awake and alert, oriented to place and time.   Memory subjective described as intact.  Attention span & concentration ability appears normal.  Speech is fluent,  without  dysarthria, dysphonia or aphasia.  Mood and affect are  appropriate.   Patient was seen for an Inspire ( hypoglossal Nerve Stimulator)  follow up visit today.   Right now his SS is scheduled for 09/12/2022 at 9 pm and his follow up with Dr. Vickey Huger is 09/18/2022. If we get a cancellation he then may have a sooner SS.    Please see below for his inspire information from today's visit.            ASSESSMENT AND PLAN 62 y.o. year old male  here with:    1) implanted hypoglossal nerve stimulator.   2)current setting 1.4V , leave there until sleep study.   3) I ordered the sleep study titration to start at 1.2V.   I plan to follow up either personally or through our NP within 1 months after PSG .   I would like to thank [name], MD and E. Lopez, [name], Md [address] [place],  [place] [address] for allowing me to meet with and to take care of this pleasant patient.    Electronically signed by: Melvyn Novas, MD 07/11/2022 11:52 AM  [facility] Board certified by The [employer] of Sleep Medicine and Diplomate of the [employer] of Sleep Medicine. Board certified In Neurology through the ABPN, Fellow of the [employer] of Neu[employer]. Medical Director of [facility].

## 2022-07-18 ENCOUNTER — Ambulatory Visit (HOSPITAL_COMMUNITY): Payer: BC Managed Care – PPO | Admitting: Anesthesiology

## 2022-07-18 ENCOUNTER — Encounter (HOSPITAL_COMMUNITY): Payer: Self-pay | Admitting: Gastroenterology

## 2022-07-18 ENCOUNTER — Ambulatory Visit (HOSPITAL_COMMUNITY)
Admission: RE | Admit: 2022-07-18 | Discharge: 2022-07-18 | Disposition: A | Discharge status: Home / Self Care | Payer: BC Managed Care – PPO | Attending: Gastroenterology | Admitting: Gastroenterology

## 2022-07-18 ENCOUNTER — Other Ambulatory Visit: Payer: Self-pay

## 2022-07-18 ENCOUNTER — Encounter (HOSPITAL_COMMUNITY)
Admission: RE | Disposition: A | Discharge status: Home / Self Care | Payer: Self-pay | Source: Home / Self Care | Attending: Gastroenterology

## 2022-07-18 DIAGNOSIS — K640 First degree hemorrhoids: Secondary | ICD-10-CM | POA: Diagnosis not present

## 2022-07-18 DIAGNOSIS — D126 Benign neoplasm of colon, unspecified: Secondary | ICD-10-CM | POA: Insufficient documentation

## 2022-07-18 DIAGNOSIS — K5730 Diverticulosis of large intestine without perforation or abscess without bleeding: Secondary | ICD-10-CM | POA: Insufficient documentation

## 2022-07-18 DIAGNOSIS — Z86010 Personal history of colonic polyps: Secondary | ICD-10-CM

## 2022-07-18 DIAGNOSIS — Z860101 Personal history of adenomatous and serrated colon polyps: Secondary | ICD-10-CM

## 2022-07-18 DIAGNOSIS — D122 Benign neoplasm of ascending colon: Secondary | ICD-10-CM | POA: Insufficient documentation

## 2022-07-18 DIAGNOSIS — I4891 Unspecified atrial fibrillation: Secondary | ICD-10-CM | POA: Diagnosis not present

## 2022-07-18 DIAGNOSIS — D630 Anemia in neoplastic disease: Secondary | ICD-10-CM | POA: Diagnosis not present

## 2022-07-18 HISTORY — PX: COLONOSCOPY WITH PROPOFOL: SHX5780

## 2022-07-18 HISTORY — PX: POLYPECTOMY: SHX5525

## 2022-07-18 SURGERY — COLONOSCOPY WITH PROPOFOL
Anesthesia: Monitor Anesthesia Care

## 2022-07-18 MED ORDER — PROPOFOL 1000 MG/100ML IV EMUL
INTRAVENOUS | Status: AC
  Filled 2022-07-18: qty 100

## 2022-07-18 MED ORDER — SODIUM CHLORIDE 0.9 % IV SOLN: INTRAVENOUS | Status: DC

## 2022-07-18 MED ORDER — PROPOFOL 500 MG/50ML IV EMUL
INTRAVENOUS | Status: DC | PRN
  Administered 2022-07-18: 125 ug/kg/min via INTRAVENOUS

## 2022-07-18 MED ORDER — LACTATED RINGERS IV SOLN: INTRAVENOUS | Status: DC

## 2022-07-18 MED ORDER — PROPOFOL 10 MG/ML IV BOLUS
INTRAVENOUS | Status: DC | PRN
  Administered 2022-07-18 (×3): 20 mg via INTRAVENOUS

## 2022-07-18 MED ORDER — LIDOCAINE 2% (20 MG/ML) 5 ML SYRINGE
INTRAMUSCULAR | Status: DC | PRN
  Administered 2022-07-18: 80 mg via INTRAVENOUS

## 2022-07-18 SURGICAL SUPPLY — 22 items

## 2022-07-18 NOTE — Anesthesia Preprocedure Evaluation (Addendum)
Anesthesia Evaluation  Patient identified by MRN, date of birth, ID band Patient awake    Reviewed: Allergy & Precautions, NPO status , Patient's Chart, lab work & pertinent test results  History of Anesthesia Complications Negative for: history of anesthetic complications  Airway Mallampati: II  TM Distance: >3 FB Neck ROM: Full    Dental  (+) Dental Advisory Given   Pulmonary sleep apnea and Continuous Positive Airway Pressure Ventilation    Pulmonary exam normal breath sounds clear to auscultation       Cardiovascular Normal cardiovascular exam+ dysrhythmias (s/p ablation 2018) Atrial Fibrillation  Rhythm:Regular Rate:Normal   Echo 09/12/21: EF 60%, no significant valvular abnormality   Neuro/Psych negative neurological ROS     GI/Hepatic negative GI ROS, Neg liver ROS,,,  Endo/Other  negative endocrine ROS    Renal/GU negative Renal ROS  negative genitourinary   Musculoskeletal  (+) Arthritis ,    Abdominal   Peds  Hematology  (+) Blood dyscrasia, anemia   Anesthesia Other Findings   Reproductive/Obstetrics                             Anesthesia Physical Anesthesia Plan  ASA: 3  Anesthesia Plan: MAC   Post-op Pain Management: Minimal or no pain anticipated   Induction: Intravenous  PONV Risk Score and Plan: 2 and Ondansetron, Dexamethasone, Treatment may vary due to age or medical condition and Propofol infusion  Airway Management Planned: Natural Airway  Additional Equipment: None  Intra-op Plan:   Post-operative Plan:   Informed Consent: I have reviewed the patients History and Physical, chart, labs and discussed the procedure including the risks, benefits and alternatives for the proposed anesthesia with the patient or authorized representative who has indicated his/her understanding and acceptance.     Dental advisory given  Plan Discussed with:  CRNA  Anesthesia Plan Comments:         Anesthesia Quick Evaluation

## 2022-07-18 NOTE — Transfer of Care (Signed)
Immediate Anesthesia Transfer of Care Note  Patient: Fred Thompson  Procedure(s) Performed: COLONOSCOPY WITH PROPOFOL POLYPECTOMY  Patient Location: PACU  Anesthesia Type:MAC  Level of Consciousness: awake, alert , and oriented  Airway & Oxygen Therapy: Patient Spontanous Breathing and Patient connected to face mask oxygen  Post-op Assessment: Report given to RN and Post -op Vital signs reviewed and stable  Post vital signs: Reviewed and stable  Last Vitals:  Vitals Value Taken Time  BP 120/66 07/18/22 1130  Temp    Pulse 66 07/18/22 1130  Resp 12 07/18/22 1130  SpO2 98 % 07/18/22 1130  Vitals shown include unvalidated device data.  Last Pain:  Vitals:   07/18/22 0952  TempSrc: Temporal  PainSc: 0-No pain         Complications: No notable events documented.

## 2022-07-18 NOTE — Discharge Instructions (Signed)
YOU HAD AN ENDOSCOPIC PROCEDURE TODAY: Refer to the procedure report and other information in the discharge instructions given to you for any specific questions about what was found during the examination. If this information does not answer your questions, please call [facility] office at [phone] to clarify.   YOU SHOULD EXPECT: Some feelings of bloating in the abdomen. Passage of more gas than usual. Walking can help get rid of the air that was put into your GI tract during the procedure and reduce the bloating. If you had a lower endoscopy (such as a colonoscopy or flexible sigmoidoscopy) you may notice spotting of blood in your stool or on the toilet paper. Some abdominal soreness may be present for a day or two, also.  DIET: Your first meal following the procedure should be a light meal and then it is ok to progress to your normal diet. A half-sandwich or bowl of soup is an example of a good first meal. Heavy or fried foods are harder to digest and may make you feel nauseous or bloated. Drink plenty of fluids but you should avoid alcoholic beverages for 24 hours. If you had a esophageal dilation, please see attached instructions for diet.    ACTIVITY: Your care partner should take you home directly after the procedure. You should plan to take it easy, moving slowly for the rest of the day. You can resume normal activity the day after the procedure however YOU SHOULD NOT DRIVE, use power tools, machinery or perform tasks that involve climbing or major physical exertion for 24 hours (because of the sedation medicines used during the test).   SYMPTOMS TO REPORT IMMEDIATELY: A gastroenterologist can be reached at any hour. Please call [phone]  for any of the following symptoms:  Following lower endoscopy (colonoscopy, flexible sigmoidoscopy) Excessive amounts of blood in the stool  Significant tenderness, worsening of abdominal pains  Swelling of the abdomen that is new, acute  Fever of 100  or higher  FOLLOW UP:  If any biopsies were taken you will be contacted by phone or by letter within the next 1-3 weeks. Call [phone]  if you have not heard about the biopsies in 3 weeks.  Please also call with any specific questions about appointments or follow up tests. YOU HAD AN ENDOSCOPIC PROCEDURE TODAY: Refer to the procedure report and other information in the discharge instructions given to you for any specific questions about what was found during the examination. If this information does not answer your questions, please call [facility] office at [phone] to clarify.   

## 2022-07-18 NOTE — Op Note (Signed)
[facility] Patient Name: Fred Thompson Procedure Date: 07/18/2022 MRN: [id number] Attending MD: Shirley Friar , MD, [id number] Date of Birth: 08/27/1960 CSN: [id number] Age: 62 Admit Type: Outpatient Procedure:                Colonoscopy Indications:              Last colonoscopy: January 2024, For therapy of                            adenomatous polyps in the colon Providers:                Shirley Friar, MD, Pollie Friar RN, RN,                            Kandice Robinsons, Technician Referring MD:             [name], MD Medicines:                Propofol per Anesthesia, Monitored Anesthesia Care Complications:            No immediate complications. Estimated Blood Loss:     Estimated blood loss was minimal. Procedure:                Pre-Anesthesia Assessment:                           - Prior to the procedure, a History and Physical                            was performed, and patient medications and                            allergies were reviewed. The patient's tolerance of                            previous anesthesia was also reviewed. The risks                            and benefits of the procedure and the sedation                            options and risks were discussed with the patient.                            All questions were answered, and informed consent                            was obtained. Prior Anticoagulants: The patient has                            taken no anticoagulant or antiplatelet agents. ASA                            Grade Assessment: III - A patient with severe  systemic disease. After reviewing the risks and                            benefits, the patient was deemed in satisfactory                            condition to undergo the procedure.                           After obtaining informed consent, the colonoscope                            was passed under direct vision.  Throughout the                            procedure, the patient's blood pressure, pulse, and                            oxygen saturations were monitored continuously. The                            [id number]-HQ190L ([id number]) Olympus colonoscope was                            introduced through the anus and advanced to the the                            cecum, identified by appendiceal orifice and                            ileocecal valve. The colonoscopy was performed                            without difficulty. The patient tolerated the                            procedure well. The quality of the bowel                            preparation was adequate and good. The ileocecal                            valve, appendiceal orifice, and rectum were                            photographed. Scope In: 10:58:07 AM Scope Out: 11:19:30 AM Scope Withdrawal Time: 0 hours 16 minutes 56 seconds  Total Procedure Duration: 0 hours 21 minutes 23 seconds  Findings:      The perianal and digital rectal examinations were normal.      A 4 mm polyp was found in the ascending colon. The polyp was       semi-sessile. The polyp was removed with a cold biopsy forceps.       Resection and retrieval were complete. Estimated blood loss was minimal.  Scattered medium-mouthed and small-mouthed diverticula were found in the       sigmoid colon and ascending colon.      Internal hemorrhoids were found during retroflexion. The hemorrhoids       were large and Grade I (internal hemorrhoids that do not prolapse). Impression:               - One 4 mm polyp in the ascending colon, removed                            with a cold biopsy forceps. Resected and retrieved.                           - Diverticulosis in the sigmoid colon and in the                            ascending colon.                           - Internal hemorrhoids. Moderate Sedation:      N/A - MAC procedure Recommendation:           - Patient has  a contact number available for                            emergencies. The signs and symptoms of potential                            delayed complications were discussed with the                            patient. Return to normal activities tomorrow.                            Written discharge instructions were provided to the                            patient.                           - High fiber diet.                           - Await pathology results.                           - Repeat colonoscopy for surveillance based on                            pathology results. Procedure Code(s):        --- Professional ---                           [phone], Colonoscopy, flexible; with biopsy, single                            or multiple Diagnosis Code(s):        ---  Professional ---                           D12.6, Benign neoplasm of colon, unspecified                           D12.2, Benign neoplasm of ascending colon                           K64.0, First degree hemorrhoids                           K57.30, Diverticulosis of large intestine without                            perforation or abscess without bleeding CPT copyright 2022 American Medical Association. All rights reserved. The codes documented in this report are preliminary and upon coder review may  be revised to meet current compliance requirements. Shirley Friar, MD 07/18/2022 11:31:36 AM This report has been signed electronically. Number of Addenda: 0

## 2022-07-18 NOTE — H&P (Signed)
Date of Initial H&P: 07/10/22  History reviewed, patient examined, no change in status, stable for surgery.

## 2022-07-18 NOTE — Interval H&P Note (Signed)
History and Physical Interval Note:  07/18/2022 10:47 AM  Fred Thompson  has presented today for surgery, with the diagnosis of Colon Polyp.  The various methods of treatment have been discussed with the patient and family. After consideration of risks, benefits and other options for treatment, the patient has consented to  Procedure(s): COLONOSCOPY WITH PROPOFOL (N/A) HOT HEMOSTASIS (ARGON PLASMA COAGULATION/BICAP) (N/A) as a surgical intervention.  The patient's history has been reviewed, patient examined, no change in status, stable for surgery.  I have reviewed the patient's chart and labs.  Questions were answered to the patient's satisfaction.     Shirley Friar

## 2022-07-19 ENCOUNTER — Encounter (HOSPITAL_COMMUNITY): Payer: Self-pay | Admitting: Gastroenterology

## 2022-07-19 LAB — SURGICAL PATHOLOGY

## 2022-07-19 NOTE — Anesthesia Postprocedure Evaluation (Signed)
Anesthesia Post Note  Patient: Fred Thompson  Procedure(s) Performed: COLONOSCOPY WITH PROPOFOL POLYPECTOMY     Patient location during evaluation: PACU Anesthesia Type: MAC Level of consciousness: awake and alert Pain management: pain level controlled Vital Signs Assessment: post-procedure vital signs reviewed and stable Respiratory status: spontaneous breathing Cardiovascular status: stable Anesthetic complications: no   No notable events documented.  Last Vitals:  Vitals:   07/18/22 1140 07/18/22 1150  BP: 124/85 134/85  Pulse: 73 62  Resp: 15 16  Temp:    SpO2: 97% 98%    Last Pain:  Vitals:   07/18/22 1140  TempSrc:   PainSc: 0-No pain                 Lewie Loron

## 2022-07-25 ENCOUNTER — Ambulatory Visit: Payer: BC Managed Care – PPO | Admitting: Neurology

## 2022-07-31 ENCOUNTER — Other Ambulatory Visit: Payer: Self-pay | Admitting: Cardiology

## 2022-07-31 DIAGNOSIS — E7800 Pure hypercholesterolemia, unspecified: Secondary | ICD-10-CM

## 2022-08-03 DIAGNOSIS — C4401 Basal cell carcinoma of skin of lip: Secondary | ICD-10-CM | POA: Diagnosis not present

## 2022-08-11 DIAGNOSIS — L905 Scar conditions and fibrosis of skin: Secondary | ICD-10-CM | POA: Diagnosis not present

## 2022-09-04 DIAGNOSIS — N401 Enlarged prostate with lower urinary tract symptoms: Secondary | ICD-10-CM | POA: Diagnosis not present

## 2022-09-04 DIAGNOSIS — R3912 Poor urinary stream: Secondary | ICD-10-CM | POA: Diagnosis not present

## 2022-09-05 ENCOUNTER — Other Ambulatory Visit: Payer: Self-pay | Admitting: Cardiology

## 2022-09-05 DIAGNOSIS — R002 Palpitations: Secondary | ICD-10-CM

## 2022-09-05 DIAGNOSIS — Z8679 Personal history of other diseases of the circulatory system: Secondary | ICD-10-CM

## 2022-09-11 DIAGNOSIS — R972 Elevated prostate specific antigen [PSA]: Secondary | ICD-10-CM | POA: Diagnosis not present

## 2022-09-11 DIAGNOSIS — N401 Enlarged prostate with lower urinary tract symptoms: Secondary | ICD-10-CM | POA: Diagnosis not present

## 2022-09-11 DIAGNOSIS — R3912 Poor urinary stream: Secondary | ICD-10-CM | POA: Diagnosis not present

## 2022-09-11 DIAGNOSIS — N5201 Erectile dysfunction due to arterial insufficiency: Secondary | ICD-10-CM | POA: Diagnosis not present

## 2022-09-12 ENCOUNTER — Ambulatory Visit (INDEPENDENT_AMBULATORY_CARE_PROVIDER_SITE_OTHER): Payer: BC Managed Care – PPO | Admitting: Neurology

## 2022-09-12 DIAGNOSIS — G4733 Obstructive sleep apnea (adult) (pediatric): Secondary | ICD-10-CM | POA: Diagnosis not present

## 2022-09-12 DIAGNOSIS — Z9682 Presence of neurostimulator: Secondary | ICD-10-CM

## 2022-09-12 DIAGNOSIS — I480 Paroxysmal atrial fibrillation: Secondary | ICD-10-CM

## 2022-09-12 DIAGNOSIS — I4892 Unspecified atrial flutter: Secondary | ICD-10-CM

## 2022-09-15 NOTE — Procedures (Signed)
Dasia Guerrier, Porfirio Mylar, MD          09/15/2022 06:37:47 PM PATIENT'S NAME: Fred Thompson, Fred Thompson DOB: 1960-06-18 MR#: [id number] DATE OF RECORDING: 09/12/2022 REFERRING M.D.: NP Ethelene Browns, Dr. Yates Decamp.  Polysomnogram with Hypoglossal Nerve Stimulation- Inspire Titration HISTORY: Fred Thompson  Rv after INSPIRE implant - 04-18-2022. Dear Dr. Jacinto Halim,  Fred Thompson is a meanwhile 62 year old mutual patient. He presented upon your referral in 07-2016 with cardiac condition (atrial fib) and irregular heartbeat with rapid ventricular responses. He tested positive for OSA in an HST. I initiated PAP therapy in 2018 and the patient was highly complaint. Also, he had tolerated CPAP for years, he was interested in INSPIRE and referred following another HST 06-13-2021- a new baseline test. 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  He saw Dr Annalee Genta in 12-2021 and underwent implantation 02-28-2022. We saw him for activation on 04-18-2022.   CURRENT MEDICATIONS: Alphagan, Cholecalciferol, Tiadylt ER, Cosopt, Proscar, Mobic, Fiber 7 PO, Crestor, Vitamin E TECHNICAL PROCEDURE:   This is a multichannel digital polysomnogram utilizing the Polysmith 12.0 system. Electrodes and sensors were applied and monitored per AASM Specifications. EEG, EOG, Chin and Limb EMG, were sampled at 200 Hz. ECG, Snore and Nasal Pressure, Thermal Airflow, Respiratory Effort, CPAP Flow and Pressure, Oximetry was sampled at 50 Hz. Digital video and audio were recorded. A RPSGT attended this study. BASELINE STUDY Lights Out was at 22:07 and Lights On at 05:04. Total recording time (TRT) was 417.5 minutes, with a total sleep time (TST) of 366.5 minutes. The patient's sleep latency was brief at 6.5 minutes. REM latency was 104.0 minutes. The sleep efficiency was 87.8%.  Inspire hypoglossal Nerve stimulator was turned on after the patient was asleep (persistent sleep, 25 minutes) and  reached 1.2 Volts. Voltage was increased to 1.3V in an unsuccessful effort to abolish the obstructive apneas seen when the patient turned supine.   SLEEP ARCHITECTURE: WASO (Wake after sleep onset) was 44.5 minutes. There were 20.5 minutes in Stage N1, 277.0 minutes Stage N2, 2.0 minutes Stage N3 and 67.0 minutes in Stage REM.  The percentage of Stage N1 was 5.6%, Stage N2 was 75.6%, Stage N3 was 0.5% and Stage R (REM sleep) was 18.3%. RESPIRATORY ANALYSIS:  There were a total of 85 respiratory events: 38 obstructive apneas, 8 central apneas and 14 mixed apneas with a total of 60 apneas and an apnea index (AI) of 9.8 /hour. There were 25 hypopneas with a hypopnea index of 4.1 /hour. The total APNEA/HYPOPNEA INDEX (AHI) was 13.9/hour and the total RESPIRATORY DISTURBANCE INDEX was 13.9 /hour.  The REM AHI was 0.9 /hour, versus a non-REM AHI of 16.8/h. The patient spent 105.0 minutes of total sleep time in the supine.  The supine AHI was 37.7/h versus a non-supine AHI of 4.4/h. OXYGEN SATURATION & CO2:   The baseline O saturation was 96.0%, with the lowest being 77.0 %. Time spent below 89% saturation equaled 6.2 minutes.  PERIODIC LIMB MOVEMENTS:    The patient had a total of 58 Periodic Limb Movements. The Periodic Limb Movement (PLM) index was 9.5 /hour and the PLM Arousal index was 0.3 /hour. The EKG was not in keeping with normal sinus rhythm (NSR). Atrial fibrillation and irregular Heart rate and rhythm were  noticed , ranging from 56 bpm to 83 bpm.  INSPIRE Titration Impression : The AHI during the baseline part was 68/h (!) before the hypoglossal  nerve stimulator was initiated-in all supine sleep.  At a setting of 1.2 V, the overall, the AHI was 7.48/h but apnea in supine sleep remained uncontrolled.   At a setting of 1.3V,  there was an exacerbation of sleep apnea again, with an AHI total of 13.45/h and supine AHI of 34.8/h. Sleep apnea in supine sleep remained uncontrolled. A higher setting  was not explored.    I certify that I have reviewed the entire raw data recording prior to the issuance of this report in accordance with the Standards of Accreditation of the American Academy of Sleep Medicine (AASM).  Melvyn Novas, MD 09-14-2022 DIPLOMAT of the ABSM, ABPN, Member of the American Academy of Sleep Medicine.             General Information  Name: Fred Thompson, Fred Thompson BMI: 26 Physician: ,   ID: [id number] Height: 77 in Technician: Domingo Cocking  Sex: Male Weight: 220 lbs Record: [id number]  Age: 53 [21-Feb-1961] Date: 09/12/2022 Scorer: Domingo Cocking    Voltage Settings Phase BASELINE THERAPY THERAPY   Protocol - - -    - - -    0Volt 1.2V 1.3V    00 02 03    - - -   INSPIRE  - - -    - - -    - - -    - - -  Time TRT 33.54m 195.47m 188.54m   TST 25.19m 176.48m 165.24m   Event Epoch 8 75 466  Sleep Stage % Wake 7.4 9.7 12.5   % REM 0.0 14.4 25.2   % N1 12.0 4.5 5.8   % N2 88.0 79.9 69.1   % N3 0.0 1.1 0.0  Respiratory Total Events 26 22 37   Obs. Apn. 17 7 14    Mixed Apn. 2 0 12   Cen. Apn. 2 4 2    Obs. Hyp. 5 11 9    Cen. Hyp. 0 0 0   AHI 62.40 7.48 13.45   Supine AHI 62.40 23.28 34.84   Prone AHI 0.00 0.00 0.00   Side AHI 0.00 3.78 5.06  Respiratory (4%) Obs. Hyp. (4%) 4.00 6.00 5.00   Cen. Hyp. (4%) 0.00 0.00 0.00   AHI (4%) 60.00 5.78 12.00   Supine AHI (4%) 60.00 17.91 29.68   Prone AHI (4%) 0.00 0.00 0.00   Side AHI (4%) 0.00 2.94 5.06  Desat Profile <= 90% 1.72m 5.71m 1.34m   <= 80% 0.64m 1.61m 0.62m   <= 70% 0.60m 0.23m 0.47m   <= 60% 0.16m 0.72m 0.2m  Arousal Index Apnea 16.8 1.4 0.4   Hypopnea 2.4 0.3 0.4   LM 0.0 1.0 1.8   Spontaneous 7.2 2.4 6.9

## 2022-09-18 ENCOUNTER — Encounter: Payer: Self-pay | Admitting: Cardiology

## 2022-09-18 ENCOUNTER — Ambulatory Visit (INDEPENDENT_AMBULATORY_CARE_PROVIDER_SITE_OTHER): Payer: BC Managed Care – PPO | Admitting: Neurology

## 2022-09-18 ENCOUNTER — Ambulatory Visit: Payer: BC Managed Care – PPO | Admitting: Cardiology

## 2022-09-18 ENCOUNTER — Telehealth: Payer: Self-pay | Admitting: Neurology

## 2022-09-18 VITALS — BP 108/71 | HR 76 | Resp 16 | Ht 77.0 in | Wt 224.2 lb

## 2022-09-18 VITALS — Ht 77.0 in | Wt 222.5 lb

## 2022-09-18 DIAGNOSIS — I480 Paroxysmal atrial fibrillation: Secondary | ICD-10-CM

## 2022-09-18 DIAGNOSIS — R002 Palpitations: Secondary | ICD-10-CM | POA: Diagnosis not present

## 2022-09-18 DIAGNOSIS — G4733 Obstructive sleep apnea (adult) (pediatric): Secondary | ICD-10-CM

## 2022-09-18 DIAGNOSIS — R931 Abnormal findings on diagnostic imaging of heart and coronary circulation: Secondary | ICD-10-CM | POA: Diagnosis not present

## 2022-09-18 DIAGNOSIS — Z8679 Personal history of other diseases of the circulatory system: Secondary | ICD-10-CM

## 2022-09-18 DIAGNOSIS — Z9682 Presence of neurostimulator: Secondary | ICD-10-CM | POA: Diagnosis not present

## 2022-09-18 DIAGNOSIS — E7800 Pure hypercholesterolemia, unspecified: Secondary | ICD-10-CM

## 2022-09-18 MED ORDER — ASPIRIN 81 MG PO TBEC
81.0000 mg | DELAYED_RELEASE_TABLET | Freq: Every day | ORAL | 3 refills | Status: AC
Start: 2022-09-18 — End: ?

## 2022-09-18 NOTE — Telephone Encounter (Signed)
Patient was see today with his inspire follow up.

## 2022-09-18 NOTE — Patient Instructions (Signed)
Fatigue If you have fatigue, you feel tired all the time and have a lack of energy or a lack of motivation. Fatigue may make it difficult to start or complete tasks because of exhaustion. Occasional or mild fatigue is often a normal response to activity or life. However, long-term (chronic) or extreme fatigue may be a symptom of a medical condition such as: Depression. Not having enough red blood cells or hemoglobin in the blood (anemia). A problem with a small gland located in the lower front part of the neck (thyroid disorder). Rheumatologic conditions. These are problems related to the body's defense system (immune system). Infections, especially certain viral infections. Fatigue can also lead to negative health outcomes over time. Follow these instructions at home: Medicines Take over-the-counter and prescription medicines only as told by your health care provider. Take a multivitamin if told by your health care provider. Do not use herbal or dietary supplements unless they are approved by your health care provider. Eating and drinking  Avoid heavy meals in the evening. Eat a well-balanced diet, which includes lean proteins, whole grains, plenty of fruits and vegetables, and low-fat dairy products. Avoid eating or drinking too many products with caffeine in them. Avoid alcohol. Drink enough fluid to keep your urine pale yellow. Activity  Exercise regularly, as told by your health care provider. Use or practice techniques to help you relax, such as yoga, tai chi, meditation, or massage therapy. Lifestyle Change situations that cause you stress. Try to keep your work and personal schedules in balance. Do not use recreational or illegal drugs. General instructions Monitor your fatigue for any changes. Go to bed and get up at the same time every day. Avoid fatigue by pacing yourself during the day and getting enough sleep at night. Maintain a healthy weight. Contact a health care  provider if: Your fatigue does not get better. You have a fever. You suddenly lose or gain weight. You have headaches. You have trouble falling asleep or sleeping through the night. You feel angry, guilty, anxious, or sad. You have swelling in your legs or another part of your body. Get help right away if: You feel confused, feel like you might faint, or faint. Your vision is blurry or you have a severe headache. You have severe pain in your abdomen, your back, or the area between your waist and hips (pelvis). You have chest pain, shortness of breath, or an irregular or fast heartbeat. You are unable to urinate, or you urinate less than normal. You have abnormal bleeding from the rectum, nose, lungs, nipples, or, if you are male, the vagina. You vomit blood. You have thoughts about hurting yourself or others. These symptoms may be an emergency. Get help right away. Call 911. Do not wait to see if the symptoms will go away. Do not drive yourself to the hospital. Get help right away if you feel like you may hurt yourself or others, or have thoughts about taking your own life. Go to your nearest emergency room or: Call 911. Call the National Suicide Prevention Lifeline at 1-800-273-8255 or 988. This is open 24 hours a day. Text the Crisis Text Line at 741741. Summary If you have fatigue, you feel tired all the time and have a lack of energy or a lack of motivation. Fatigue may make it difficult to start or complete tasks because of exhaustion. Long-term (chronic) or extreme fatigue may be a symptom of a medical condition. Exercise regularly, as told by your health care provider.   Change situations that cause you stress. Try to keep your work and personal schedules in balance. This information is not intended to replace advice given to you by your health care provider. Make sure you discuss any questions you have with your health care provider. Document Revised: 12/27/2020 Document  Reviewed: 12/27/2020 Elsevier Patient Education  2024 Elsevier Inc.  

## 2022-09-18 NOTE — Progress Notes (Addendum)
Provider:  Melvyn Novas, MD  Primary Care Physician:  [name], MD [address] [place] [address]     Referring Provider: [name], Md [address] [place],  [place] [address]          Chief Complaint according to patient   Patient presents with:     New Patient (Initial Visit)           HISTORY OF PRESENT ILLNESS:  Fred Thompson is a 62 y.o. male patient who is here for revisit 09/18/2022 for Inspire titration follow up, 09-12-2022  date of PSG.  Chief concern according to patient :  here to have Inspire outgoing setting to 1.2 Volt, he assured [place] he normally sleeps not in supine position. 1.4 V setting had been uncomfortable.  This will be a range 1.0 - 1.4v. level 3 on his remote. Pause from 15 to 20,  onset start 30 minutes for 8 hour duration.  INSPIRE Titration Impression : The AHI during the baseline part was 68/h (!) before the hypoglossal nerve stimulator was initiated-in all supine sleep.  At a setting of 1.2 V, the overall, the AHI was 7.48/h but apnea in supine sleep remained uncontrolled.   At a setting of 1.3V,  there was an exacerbation of sleep apnea again, with an AHI total of 13.45/h and supine AHI of 34.8/h. Sleep apnea in supine sleep remained uncontrolled. A higher setting was not explored.     Rv after INSPIRE implant - 04-18-2022. Dear Dr. Jacinto Halim,  Fred Thompson is a meanwhile 62 year old mutual patient. He presented upon your referral in 07-2016 with cardiac condition (atrial fib) and irregular heartbeat with rapid ventricular responses. He tested positive for OSA in an HST. I initiated PAP therapy in 2018 and the patient was highly complaint. Also, he had tolerated CPAP for years, he was interested in INSPIRE and referred following another HST 06-13-2021- a new baseline test. 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  He saw Dr  Annalee Genta in 12-2021 and underwent implantation 02-28-2022. We saw him for activation on 04-18-2022.    CURRENT MEDICATIONS: Alphagan, Cholecalciferol, Tiadylt ER, Cosopt, Proscar, Mobic, Fiber 7 PO, Crestor, Vitamin E    Review of Systems: Out of a complete 14 system review, the patient complains of only the following symptoms, and all other reviewed systems are negative.:    How likely are you to doze in the following situations: 0 = not likely, 1 = slight chance, 2 = moderate chance, 3 = high chance   Sitting and Reading? Watching Television? Sitting inactive in a public place (theater or meeting)? As a passenger in a car for an hour without a break? Lying down in the afternoon when circumstances permit? Sitting and talking to someone? Sitting quietly after lunch without alcohol? In a car, while stopped for a few minutes in traffic?   Total = 5/ 24 points   FSS endorsed at 33/ 63 points.   Social History   Socioeconomic History   Marital status: Married    Spouse name: Not on file   Number of children: 1   Years of education: Not on file   Highest education level: Not on file  Occupational History   Not on file  Tobacco Use   Smoking status: Never   Smokeless tobacco: Never  Vaping Use   Vaping  Use: Never used  Substance and Sexual Activity   Alcohol use: Yes    Comment: occasional   Drug use: Never   Sexual activity: Yes  Other Topics Concern   Not on file  Social History Narrative   Not on file   Social Determinants of Health   Financial Resource Strain: Not on file  Food Insecurity: Not on file  Transportation Needs: Not on file  Physical Activity: Not on file  Stress: Not on file  Social Connections: Not on file    Family History  Problem Relation Age of Onset   CAD Father        quad bypass @ 29   Healthy Sister     Past Medical History:  Diagnosis Date   Allergic rhinitis 01/20/2016   Arthritis    BPH (benign prostatic hyperplasia)  01/01/2016   Dyslipidemia (high LDL; low HDL) 01/01/2016   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 on diltiazem   Hemorrhoids 01/20/2016   LV dysfunction    a. echo 12/2015: EF 45-50%, mild global HK, normal diastolic function, mildly dilated LA, trace MR, trace TR, unable to estimate PASP   PAF (paroxysmal atrial fibrillation) (HCC)    a. diagnosed in 12/2015; b. failed flecainide and multaq; c. CHADS2VASc => 1 (CHF); d. on xarelto; e. s/p successful Afib ablation 09/29/2016   Paroxysmal atrial flutter (HCC) 01/02/2016   ablation 2018   PVC (premature ventricular contraction)    Sleep apnea    uses CPAP nightly    Past Surgical History:  Procedure Laterality Date   APPENDECTOMY  1973   ATRIAL FIBRILLATION ABLATION N/A 09/29/2016   Procedure: Atrial Fibrillation Ablation;  Surgeon: Regan Lemming, MD;  Location: [facility];  Service: Cardiovascular;  Laterality: N/A;   CATARACT EXTRACTION Right 09/2017   has had infection, then oil bubble, the gas bubble 02/06/2018   COLONOSCOPY WITH PROPOFOL N/A 07/18/2022   Procedure: COLONOSCOPY WITH PROPOFOL;  Surgeon: Charlott Rakes, MD;  Location: [facility];  Service: Gastroenterology;  Laterality: N/A;   DRUG INDUCED ENDOSCOPY N/A 10/25/2021   Procedure: DRUG INDUCED SLEEP ENDOSCOPY;  Surgeon: Osborn Coho, MD;  Location: [facility];  Service: ENT;  Laterality: N/A;   IMPLANTATION OF HYPOGLOSSAL NERVE STIMULATOR Right 02/28/2022   Procedure: IMPLANTATION OF HYPOGLOSSAL NERVE STIMULATOR;  Surgeon: Osborn Coho, MD;  Location: [facility];  Service: ENT;  Laterality: Right;   KNEE SURGERY Right 1999   arthroscopic   NM RENAL LASIX ([facility] HX) Bilateral 2007   POLYPECTOMY  07/18/2022   Procedure: POLYPECTOMY;  Surgeon: Charlott Rakes, MD;  Location: [facility];  Service: Gastroenterology;;   TRANSANAL HEMORRHOIDAL DEARTERIALIZATION N/A 07/08/2019   Procedure: TRANSANAL HEMORRHOIDAL DEARTERIALIZATION;   Surgeon: Romie Levee, MD;  Location: [facility];  Service: General;  Laterality: N/A;     Current Outpatient Medications on File Prior to Visit  Medication Sig Dispense Refill   cholecalciferol (VITAMIN D3) 25 MCG (1000 UNIT) tablet Take 1,000 Units by mouth daily.     diltiazem (TIAZAC) 180 MG 24 hr capsule TAKE 1 CAPSULE DAILY 90 capsule 3   dorzolamide-timolol (COSOPT) 22.3-6.8 MG/ML ophthalmic solution Place 2 drops into the right eye daily.     finasteride (PROSCAR) 5 MG tablet Take 5 mg by mouth daily.     rosuvastatin (CRESTOR) 40 MG tablet TAKE AS INSTRUCTED BY YOUR PRESCRIBER 90 tablet 0   vitamin E 400 UNIT capsule Take 400 Units by mouth daily.  No current facility-administered medications on file prior to visit.    Allergies  Allergen Reactions   Atorvastatin Other (See Comments)    Fatigue, myalgias Other reaction(s): Other (See Comments) Fatigue, myalgias     DIAGNOSTIC DATA (LABS, IMAGING, TESTING) - I reviewed patient records, labs, notes, testing and imaging myself where available.  Lab Results  Component Value Date   WBC 5.6 09/30/2016   HGB 15.6 07/08/2019   HCT 46.0 07/08/2019   MCV 86.1 09/30/2016   PLT 181 09/30/2016      Component Value Date/Time   NA 141 07/08/2019 1250   K 3.9 07/08/2019 1250   CL 105 07/08/2019 1250   CO2 24 09/29/2016 1028   GLUCOSE 90 07/08/2019 1250   BUN 13 07/08/2019 1250   CREATININE 0.90 07/08/2019 1250   CALCIUM 8.7 (L) 09/29/2016 1028   PROT 7.2 04/06/2017 0738   ALBUMIN 4.4 04/06/2017 0738   AST 22 04/06/2017 0738   ALT 26 04/06/2017 0738   ALKPHOS 77 04/06/2017 0738   BILITOT 0.3 04/06/2017 0738   GFRNONAA >60 09/29/2016 1028   GFRAA >60 09/29/2016 1028   Lab Results  Component Value Date   CHOL 140 07/08/2021   HDL 45 07/08/2021   LDLCALC 83 07/08/2021   TRIG 57 07/08/2021   CHOLHDL 3.1 04/06/2017   No results found for: "HGBA1C" No results found for: "VITAMINB12" No results  found for: "TSH"  PHYSICAL EXAM:  Today's Vitals   09/18/22 1110  Weight: 222 lb 8 oz (100.9 kg)  Height: 6\' 5"  (1.956 m)   Body mass index is 26.38 kg/m.   Wt Readings from Last 3 Encounters:  09/18/22 222 lb 8 oz (100.9 kg)  07/18/22 222 lb 0.1 oz (100.7 kg)  07/11/22 222 lb (100.7 kg)     Ht Readings from Last 3 Encounters:  09/18/22 6\' 5"  (1.956 m)  07/18/22 6\' 5"  (1.956 m)  07/11/22 6\' 5"  (1.956 m)           ASSESSMENT AND PLAN 62 y.o. year old male  here after recent in lab titration which span only 3 settings. :  Best result under 1.2 V , AHI 7/ AASM criteria.     1) INSPIRE device was reset to 1.2 V after the patient noted a that 1.4 V felt to strong and interfered with sleep comfort.   2) Encouraged power naps to help with daytime  fatigue .   3) HST while on inspire in 12 months.    I plan to follow up either personally or through our NP within 12 months.   I would like to thank Docia Chuck, [name], Md [address] [place],  [place] [address] for allowing me to meet with and to take care of this pleasant patient.    After spending a total time of  29  minutes face to face and additional time for physical and neurologic examination, review of laboratory studies,  personal review of imaging studies, reports and results of other testing and review of referral information / records as far as provided in visit,   Electronically signed by: Melvyn Novas, MD 09/18/2022 11:43 AM  [facility] Board certified by The [employer] of Sleep Medicine and Diplomate of the [employer] of Sleep Medicine. Board certified In Neurology through the ABPN, Fellow of the [employer] of Neu[employer].

## 2022-09-18 NOTE — Progress Notes (Signed)
Subjective:  Primary Physician:  [name], MD  Patient ID: Fred Thompson, male    DOB: 03-08-1961, 62 y.o.   MRN: [id number]  Chief Complaint  Patient presents with   Palpitations   Dyspnea on exertion   Non-ischemic cardiomyopathy [facility])   HPI: Fred Thompson  is a 62 y.o.male  with OSA SP inspire implant, paroxysmal atrial fibrillation, paroxysmal atrial flutter who initially presented with syncope on 01/01/2016.  He was unable to tolerate flecainide, Multac, due to marked fatigue and also efficacy, underwent atrial fibrillation ablation on 09/29/2016.  Due to low CHA2DS2-VASc score, he is not on anticoagulants.  He presents for 58-month office visit, remains asymptomatic.  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 on diltiazem   Hemorrhoids 01/20/2016   LV dysfunction    a. echo 12/2015: EF 45-50%, mild global HK, normal diastolic function, mildly dilated LA, trace MR, trace TR, unable to estimate PASP   PAF (paroxysmal atrial fibrillation) (HCC)    a. diagnosed in 12/2015; b. failed flecainide and multaq; c. CHADS2VASc => 1 (CHF); d. on xarelto; e. s/p successful Afib ablation 09/29/2016   Paroxysmal atrial flutter (HCC) 01/02/2016   ablation 2018   PVC (premature ventricular contraction)    Sleep apnea    uses CPAP nightly    Past Surgical History:  Procedure Laterality Date   APPENDECTOMY  1973   ATRIAL FIBRILLATION ABLATION N/A 09/29/2016   Procedure: Atrial Fibrillation Ablation;  Surgeon: Regan Lemming, MD;  [facility];  Service: Cardiovascular;  Laterality: N/A;   CATARACT EXTRACTION Right 09/2017   has had infection, then oil bubble, the gas bubble 02/06/2018   COLONOSCOPY WITH PROPOFOL N/A 07/18/2022   Procedure: COLONOSCOPY WITH PROPOFOL;  Surgeon: Charlott Rakes,  MD;  [facility]: [facility];  Service: Gastroenterology;  Laterality: N/A;   DRUG INDUCED ENDOSCOPY N/A 10/25/2021   Procedure: DRUG INDUCED SLEEP ENDOSCOPY;  Surgeon: Osborn Coho, MD;  Location: [facility];  Service: ENT;  Laterality: N/A;   IMPLANTATION OF HYPOGLOSSAL NERVE STIMULATOR Right 02/28/2022   Procedure: IMPLANTATION OF HYPOGLOSSAL NERVE STIMULATOR;  Surgeon: Osborn Coho, MD;  Location: [facility];  Service: ENT;  Laterality: Right;   KNEE SURGERY Right 1999   arthroscopic   NM RENAL LASIX ([facility] HX) Bilateral 2007   POLYPECTOMY  07/18/2022   Procedure: POLYPECTOMY;  Surgeon: Charlott Rakes, MD;  [facility];  Service: Gastroenterology;;   TRANSANAL HEMORRHOIDAL DEARTERIALIZATION N/A 07/08/2019   Procedure: TRANSANAL HEMORRHOIDAL DEARTERIALIZATION;  Surgeon: Romie Levee, MD;  Location: [facility];  Service: General;  Laterality: N/A;   Social History   Tobacco Use   Smoking status: Never   Smokeless tobacco: Never  Substance Use Topics   Alcohol use: Yes    Comment: occasional  Married    Current Outpatient Medications:    alfuzosin (UROXATRAL) 10 MG 24 hr tablet, Take 10 mg by mouth daily., Disp: , Rfl:    cholecalciferol (VITAMIN D3) 25 MCG (1000 UNIT) tablet, Take 1,000 Units by mouth daily., Disp: , Rfl:    diltiazem (TIAZAC) 180 MG 24 hr capsule, TAKE 1 CAPSULE DAILY, Disp: 90 capsule, Rfl: 3   dorzolamide-timolol (COSOPT) 22.3-6.8 MG/ML ophthalmic solution, Place 2 drops into the right eye daily., Disp: , Rfl:  rosuvastatin (CRESTOR) 40 MG tablet, TAKE AS INSTRUCTED BY YOUR PRESCRIBER, Disp: 90 tablet, Rfl: 0   vitamin E 400 UNIT capsule, Take 400 Units by mouth daily., Disp: , Rfl:    Review of Systems  Cardiovascular:  Negative for chest pain, dyspnea on exertion and leg swelling.    Objective:  Blood pressure 108/71, pulse 76, resp. rate 16, height 6\' 5"  (1.956 m), weight 224 lb 3.2 oz  (101.7 kg), SpO2 96 %. Body mass index is 26.59 kg/m.    09/18/2022    3:05 PM 09/18/2022   11:10 AM 07/18/2022   11:50 AM  Vitals with BMI  Height 6\' 5"  6\' 5"    Weight 224 lbs 3 oz 222 lbs 8 oz   BMI 26.58 26.38   Systolic 108  134  Diastolic 71  85  Pulse 76  62   Physical Exam Neck:     Vascular: No JVD.  Cardiovascular:     Rate and Rhythm: Normal rate and regular rhythm.     Pulses: Intact distal pulses.     Heart sounds: Normal heart sounds. No murmur heard.    No gallop.  Pulmonary:     Effort: Pulmonary effort is normal.     Breath sounds: Normal breath sounds.  Abdominal:     General: Bowel sounds are normal.     Palpations: Abdomen is soft.  Musculoskeletal:     Right lower leg: No edema.     Left lower leg: No edema.    External labs:  Cholesterol, total 141.000 m 12/22/2021 HDL 46.000 mg 12/22/2021 LDL 75.000 mg 12/22/2021 Triglycerides 112.000 m 12/22/2021  A1C 5.700 % 12/22/2021 TSH 0.880 12/22/2021  Hemoglobin 14.200 g/d 12/22/2021  Creatinine, Serum 0.930 mg/ 12/22/2021 Potassium 3.900 mm 12/22/2021 ALT (SGPT) 34.000 U/L 12/22/2021  Labs 12/07/2020:  Hb 14.6/HCT 43.2, platelets 191, normal indicis.  Serum glucose 95 mg, BUN 15, creatinine 0.96, EGFR 89 mL, potassium 4.2, LFTs normal.  TSH normal.  Total cholesterol 166, triglycerides 103, HDL 44, LDL 103.  Non-HDL cholesterol 03/20/2020.  CARDIAC STUDIES:   Exercise sestamibi stress test 07/14/2016: 1. Resting EKG demonstrates atypical atrial flutter with variable ventricular response.  Stress EKG was negative for myocardial ischemia.  Patient appeared to have developed atrial fibrillation with peak exercise which reverted to atypical atrial flutter versus coarse atrial fibrillation in recovery.  Rare PVC.  Nonspecific ST segment changes noted at both rest and stress.  Patient exercised for 7:30 minutes and achieved 10.35 METs.  Normal blood pressure response. 2. Left ventricular cavity is noted to be  enlarged on the rest and stress studies and measured 142 mL.  SPECT images demonstrate homogeneous tracer distribution throughout the myocardium.  Gated SPECT imaging demonstrates global hypokinesis. The left ventricular ejection fraction was calculated or visually estimated to be 32%.  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 This is an intermediate risk study, clinical correlation recommended.  Coronary calcium score with CT FFR 09/22/2016: Coronary Calcium Score:  372 Agatston units. 89th percentile in [facility] data base. Coronary Arteries: Mild heterogeneous plaque in the coronary arteries.  Right dominant with no anomalies LM:  No significant coronary disease. LAD system: There was mixed plaque in the proximal LAD with mild stenosis. Small D1, moderate D2, small D3 without significant disease. Circumflex system: There was a large ramus present with calcified plaque and mild stenosis present in the proximal vessel. The AV LCx was a relatively small vessel. There was calcified plaque at the ostium without significant stenosis.  There was calcified plaque in the mid to distal vessel, probably with mild stenosis (LCx was small in caliber) RCA: Mixed plaque in the mid to distal RCA without significant stenosis.  PCV ECHOCARDIOGRAM COMPLETE 09/12/2021  Left ventricle cavity is normal in size and wall thickness. Normal global wall motion. Normal LV systolic function with EF 60%. Normal diastolic filling pattern. Left atrial cavity is borderline dilated. No significant valvular abnormality. Previous study on 06/16/2020, LVEF improved from 45-50%, reported moderate LA dilatation, grade 1 diastolic dysfunction.   Zio Patch Extended out patient EKG monitoring 14 days starting 08/08/2021:  Predominant rhythm is normal sinus rhythm.  Minimum heart rate 54, maximum heart rate 169 bpm with average heart rate of 74 bpm. There were 3 NSVT episodes longest 5 beats, EKG evaluation = atrial couplets and atrial  ectopy, occasional ventricular couplets.  There were 11 SVT episodes essentially brief atrial tachycardia, longest 18 seconds with average heart rate of 130 bpm.  Otherwise no atrial fibrillation.  Occasional PACs and PVCs and ventricular bigeminy and trigeminy were noted. There were no patient activated events.  EKG:  EKG 09/18/2022: Normal sinus rhythm at rate of 70 bpm, normal EKG.  Assessment & Recommendations:      ICD-10-CM   1. Coronary Calcium Score: 372 Agatston units. 89th percentile [facility] data base 09/22/2016  R93.1     2. Hypercholesteremia  E78.00     3. History of atrial fibrillation  Z86.79 EKG 12-Lead    4. OSA on CPAP  G47.33      Medications Discontinued During This Encounter  Medication Reason   finasteride (PROSCAR) 5 MG tablet    Fred Thompson  is a 62 y.o.  male  with OSA SP inspire implant, paroxysmal atrial fibrillation, paroxysmal atrial flutter who initially presented with syncope on 01/01/2016.  He was unable to tolerate flecainide, Multac, due to marked fatigue and also efficacy, underwent atrial fibrillation ablation on 09/29/2016.  Due to low CHA2DS2-VASc score, he is not on anticoagulants.  1. Coronary Calcium Score: 372 Agatston units. 89th percentile [facility] data base 09/22/2016 Patient is presently on high intensity statins with excellent control of lipids.  Advised him to start aspirin 81 mg daily. - aspirin EC 81 MG tablet; Take 1 tablet (81 mg total) by mouth daily.  Dispense: 90 tablet; Refill: 3  2. Hypercholesteremia  Labs reviewed, lipids under excellent control, no symptoms of angina  3. History of atrial fibrillation No recurrence of atrial fibrillation since ablation and also has now been treated for OSA with inspire implantation. - EKG 12-Lead  4. OSA on InSpire implant  Other orders - alfuzosin (UROXATRAL) 10 MG 24 hr tablet; Take 10 mg by mouth daily.  As patient's risk factors are well-controlled and he has had no recurrence of  atrial fibrillation, I will see him back on a as needed basis.   Yates Decamp, MD, [facility] 09/18/2022, 3:33 PM Office: [phone] Pager: [phone]

## 2022-10-26 DIAGNOSIS — M1712 Unilateral primary osteoarthritis, left knee: Secondary | ICD-10-CM | POA: Diagnosis not present

## 2022-10-26 DIAGNOSIS — M1711 Unilateral primary osteoarthritis, right knee: Secondary | ICD-10-CM | POA: Diagnosis not present

## 2022-10-26 DIAGNOSIS — M170 Bilateral primary osteoarthritis of knee: Secondary | ICD-10-CM | POA: Diagnosis not present

## 2022-10-27 ENCOUNTER — Other Ambulatory Visit: Payer: Self-pay | Admitting: Cardiology

## 2022-10-27 DIAGNOSIS — E7800 Pure hypercholesterolemia, unspecified: Secondary | ICD-10-CM

## 2022-11-02 ENCOUNTER — Encounter: Payer: Self-pay | Admitting: Cardiology

## 2022-11-08 DIAGNOSIS — L570 Actinic keratosis: Secondary | ICD-10-CM | POA: Diagnosis not present

## 2022-11-08 DIAGNOSIS — D225 Melanocytic nevi of trunk: Secondary | ICD-10-CM | POA: Diagnosis not present

## 2022-11-08 DIAGNOSIS — L814 Other melanin hyperpigmentation: Secondary | ICD-10-CM | POA: Diagnosis not present

## 2022-11-08 DIAGNOSIS — D485 Neoplasm of uncertain behavior of skin: Secondary | ICD-10-CM | POA: Diagnosis not present

## 2022-11-08 DIAGNOSIS — Z85828 Personal history of other malignant neoplasm of skin: Secondary | ICD-10-CM | POA: Diagnosis not present

## 2022-11-08 DIAGNOSIS — C44619 Basal cell carcinoma of skin of left upper limb, including shoulder: Secondary | ICD-10-CM | POA: Diagnosis not present

## 2022-11-08 DIAGNOSIS — L821 Other seborrheic keratosis: Secondary | ICD-10-CM | POA: Diagnosis not present

## 2022-11-08 DIAGNOSIS — C44319 Basal cell carcinoma of skin of other parts of face: Secondary | ICD-10-CM | POA: Diagnosis not present

## 2022-11-14 DIAGNOSIS — I4891 Unspecified atrial fibrillation: Secondary | ICD-10-CM | POA: Diagnosis not present

## 2022-11-14 DIAGNOSIS — Z79899 Other long term (current) drug therapy: Secondary | ICD-10-CM | POA: Diagnosis not present

## 2022-11-14 DIAGNOSIS — E7800 Pure hypercholesterolemia, unspecified: Secondary | ICD-10-CM | POA: Diagnosis not present

## 2022-11-14 DIAGNOSIS — Z01818 Encounter for other preprocedural examination: Secondary | ICD-10-CM | POA: Diagnosis not present

## 2022-12-13 DIAGNOSIS — M1711 Unilateral primary osteoarthritis, right knee: Secondary | ICD-10-CM | POA: Diagnosis not present

## 2022-12-21 DIAGNOSIS — M1731 Unilateral post-traumatic osteoarthritis, right knee: Secondary | ICD-10-CM | POA: Diagnosis not present

## 2022-12-21 DIAGNOSIS — R262 Difficulty in walking, not elsewhere classified: Secondary | ICD-10-CM | POA: Diagnosis not present

## 2022-12-21 DIAGNOSIS — M25661 Stiffness of right knee, not elsewhere classified: Secondary | ICD-10-CM | POA: Diagnosis not present

## 2022-12-29 DIAGNOSIS — M21161 Varus deformity, not elsewhere classified, right knee: Secondary | ICD-10-CM | POA: Diagnosis not present

## 2022-12-29 DIAGNOSIS — M1711 Unilateral primary osteoarthritis, right knee: Secondary | ICD-10-CM | POA: Diagnosis not present

## 2023-01-04 DIAGNOSIS — Z79899 Other long term (current) drug therapy: Secondary | ICD-10-CM | POA: Diagnosis not present

## 2023-01-04 DIAGNOSIS — Z131 Encounter for screening for diabetes mellitus: Secondary | ICD-10-CM | POA: Diagnosis not present

## 2023-01-04 DIAGNOSIS — E7800 Pure hypercholesterolemia, unspecified: Secondary | ICD-10-CM | POA: Diagnosis not present

## 2023-01-04 DIAGNOSIS — Z0000 Encounter for general adult medical examination without abnormal findings: Secondary | ICD-10-CM | POA: Diagnosis not present

## 2023-01-04 DIAGNOSIS — Z23 Encounter for immunization: Secondary | ICD-10-CM | POA: Diagnosis not present

## 2023-01-09 DIAGNOSIS — M1711 Unilateral primary osteoarthritis, right knee: Secondary | ICD-10-CM | POA: Diagnosis not present

## 2023-02-12 DIAGNOSIS — D2262 Melanocytic nevi of left upper limb, including shoulder: Secondary | ICD-10-CM | POA: Diagnosis not present

## 2023-02-12 DIAGNOSIS — D235 Other benign neoplasm of skin of trunk: Secondary | ICD-10-CM | POA: Diagnosis not present

## 2023-02-12 DIAGNOSIS — L821 Other seborrheic keratosis: Secondary | ICD-10-CM | POA: Diagnosis not present

## 2023-02-12 DIAGNOSIS — D225 Melanocytic nevi of trunk: Secondary | ICD-10-CM | POA: Diagnosis not present

## 2023-02-12 DIAGNOSIS — C44619 Basal cell carcinoma of skin of left upper limb, including shoulder: Secondary | ICD-10-CM | POA: Diagnosis not present

## 2023-02-12 DIAGNOSIS — D2261 Melanocytic nevi of right upper limb, including shoulder: Secondary | ICD-10-CM | POA: Diagnosis not present

## 2023-02-12 DIAGNOSIS — D485 Neoplasm of uncertain behavior of skin: Secondary | ICD-10-CM | POA: Diagnosis not present

## 2023-02-19 DIAGNOSIS — C44319 Basal cell carcinoma of skin of other parts of face: Secondary | ICD-10-CM | POA: Diagnosis not present

## 2023-03-22 DIAGNOSIS — I872 Venous insufficiency (chronic) (peripheral): Secondary | ICD-10-CM | POA: Diagnosis not present

## 2023-05-15 DIAGNOSIS — M1712 Unilateral primary osteoarthritis, left knee: Secondary | ICD-10-CM | POA: Diagnosis not present

## 2023-05-31 ENCOUNTER — Telehealth: Payer: Self-pay | Admitting: *Deleted

## 2023-05-31 NOTE — Telephone Encounter (Signed)
   Pre-operative Risk Assessment    Patient Name: Fred Thompson  DOB: 1960/12/14 MRN: [id number]   Date of last office visit: 09/18/22 DR. Jacinto Halim Date of next office visit: NONE   Request for Surgical Clearance    Procedure:   LEFT TOTAL KNEE ARTHROPLASTY  Date of Surgery:  Clearance TBD                                Surgeon:  DR. Gean Birchwood Surgeon's Group or Practice Name:  [facility] Phone number:  [phone] REBECCA LANG Fax number:  [phone]   Type of Clearance Requested:   - Medical  - Pharmacy:  Hold Aspirin     Type of Anesthesia:  Spinal   Additional requests/questions:    Elpidio Anis   05/31/2023, 10:17 AM

## 2023-06-01 NOTE — Telephone Encounter (Signed)
   Name: Fred Thompson  DOB: 1960/03/27  MRN: [id number]  Primary Cardiologist: None   Preoperative team, please contact this patient and set up a phone call appointment for further preoperative risk assessment. Please obtain consent and complete medication review. Thank you for your help.  I confirm that guidance regarding antiplatelet and oral anticoagulation therapy has been completed and, if necessary, noted below.  Regarding ASA therapy, we recommend continuation of ASA throughout the perioperative period.  However, if the surgeon feels that cessation of ASA is required in the perioperative period, it may be stopped 5-7 days prior to surgery with a plan to resume it as soon as felt to be feasible from a surgical standpoint in the post-operative period.  I also confirmed the patient resides in the state of [place]. As per [facility] Medical Board telemedicine laws, the patient must reside in the state in which the provider is licensed.   Joylene Grapes, NP 06/01/2023, 9:27 AM [facility]

## 2023-06-01 NOTE — Telephone Encounter (Signed)
 PRIMARY CARD DR. Jacinto Halim.   Tried to call the pt to schedule tele preop appt, though no answer.

## 2023-06-07 DIAGNOSIS — Z01818 Encounter for other preprocedural examination: Secondary | ICD-10-CM | POA: Diagnosis not present

## 2023-06-07 DIAGNOSIS — Z79899 Other long term (current) drug therapy: Secondary | ICD-10-CM | POA: Diagnosis not present

## 2023-06-07 DIAGNOSIS — E7800 Pure hypercholesterolemia, unspecified: Secondary | ICD-10-CM | POA: Diagnosis not present

## 2023-06-07 DIAGNOSIS — I4891 Unspecified atrial fibrillation: Secondary | ICD-10-CM | POA: Diagnosis not present

## 2023-06-08 ENCOUNTER — Telehealth: Payer: Self-pay

## 2023-06-08 NOTE — Telephone Encounter (Signed)
 Called patient to set up a Tele visit for a pre-op on 06/13/23 @ 8:40, meds rec and consent done.     Patient Consent for Virtual Visit        Fred Thompson has provided verbal consent on 06/08/2023 for a virtual visit (video or telephone).   CONSENT FOR VIRTUAL VISIT FOR:  Fred Thompson  By participating in this virtual visit I agree to the following:  I hereby voluntarily request, consent and authorize [facility] and its employed or contracted physicians, physician assistants, nurse practitioners or other licensed health care professionals (the Practitioner), to provide me with telemedicine health care services (the "Services") as deemed necessary by the treating Practitioner. I acknowledge and consent to receive the Services by the Practitioner via telemedicine. I understand that the telemedicine visit will involve communicating with the Practitioner through live audiovisual communication technology and the disclosure of certain medical information by electronic transmission. I acknowledge that I have been given the opportunity to request an in-person assessment or other available alternative prior to the telemedicine visit and am voluntarily participating in the telemedicine visit.  I understand that I have the right to withhold or withdraw my consent to the use of telemedicine in the course of my care at any time, without affecting my right to future care or treatment, and that the Practitioner or I may terminate the telemedicine visit at any time. I understand that I have the right to inspect all information obtained and/or recorded in the course of the telemedicine visit and may receive copies of available information for a reasonable fee.  I understand that some of the potential risks of receiving the Services via telemedicine include:  Delay or interruption in medical evaluation due to technological equipment failure or disruption; Information transmitted may not be  sufficient (e.g. poor resolution of images) to allow for appropriate medical decision making by the Practitioner; and/or  In rare instances, security protocols could fail, causing a breach of personal health information.  Furthermore, I acknowledge that it is my responsibility to provide information about my medical history, conditions and care that is complete and accurate to the best of my ability. I acknowledge that Practitioner's advice, recommendations, and/or decision may be based on factors not within their control, such as incomplete or inaccurate data provided by me or distortions of diagnostic images or specimens that may result from electronic transmissions. I understand that the practice of medicine is not an exact science and that Practitioner makes no warranties or guarantees regarding treatment outcomes. I acknowledge that a copy of this consent can be made available to me via my patient portal [facility] MyChart), or I can request a printed copy by calling the office of [facility].    I understand that my insurance will be billed for this visit.   I have read or had this consent read to me. I understand the contents of this consent, which adequately explains the benefits and risks of the Services being provided via telemedicine.  I have been provided ample opportunity to ask questions regarding this consent and the Services and have had my questions answered to my satisfaction. I give my informed consent for the services to be provided through the use of telemedicine in my medical care

## 2023-06-08 NOTE — Telephone Encounter (Signed)
 Called patient to set up a Tele visit for a pre-op on 06/13/23 @ 8:40, meds rec and consent done.

## 2023-06-08 NOTE — Telephone Encounter (Signed)
 Requesting office sent duplicate request. Pt has been scheduled tele preop appt 06/13/23. Once the pt has been cleared our office will be sure to fax clearance notes.

## 2023-06-12 NOTE — Progress Notes (Unsigned)
 Virtual Visit via Telephone Note   Because of Fred Thompson co-morbid illnesses, he is at least at moderate risk for complications without adequate follow up.  This format is felt to be most appropriate for this patient at this time.  Due to technical limitations with video connection (technology), today's appointment will be conducted as an audio only telehealth visit, and Fred Thompson verbally agreed to proceed in this manner.   All issues noted in this document were discussed and addressed.  No physical exam could be performed with this format.  Evaluation Performed:  Preoperative cardiovascular risk assessment _____________   Date:  06/13/2023   Patient ID:  ADRIN Thompson, DOB [birth year], MRN [id number] Patient Location:  Home Provider location:   Office  Primary Care Provider:  [name], MD Primary Cardiologist:  Yates Decamp, MD  Chief Complaint / Patient Profile   63 y.o. y/o male with a h/o OSA s/p Inspire device, PAF/flutter not on anticoagulation, CAD with CAC score 372 (89th percentile) mild stenosis with no evidence of obstruction,  who is pending left total knee arthroplasty with Dr. Turner Daniels, date TBD and presents today for telephonic preoperative cardiovascular risk assessment.  History of Present Illness    Fred Thompson is a 63 y.o. male who presents via audio/video conferencing for a telehealth visit today.  Pt was last seen in cardiology clinic on 09/18/23 by Dr. Jacinto Halim.  At that time Fred Thompson was doing well.  The patient is now pending procedure as outlined above. Since his last visit, he denies chest pain, shortness of breath, lower extremity edema, fatigue, palpitations, diaphoresis, weakness, presyncope, syncope, orthopnea, and PND. He continues to work in [employer] and walks throughout the day as well as stays active doing yard work.    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 on diltiazem   Hemorrhoids 01/20/2016   LV dysfunction    a. echo 12/2015: EF 45-50%, mild global HK, normal diastolic function, mildly dilated LA, trace MR, trace TR, unable to estimate PASP   PAF (paroxysmal atrial fibrillation) (HCC)    a. diagnosed in 12/2015; b. failed flecainide and multaq; c. CHADS2VASc => 1 (CHF); d. on xarelto; e. s/p successful Afib ablation 09/29/2016   Paroxysmal atrial flutter (HCC) 01/02/2016   ablation 2018   PVC (premature ventricular contraction)    Sleep apnea    uses CPAP nightly   Past Surgical History:  Procedure Laterality Date   APPENDECTOMY  1973   ATRIAL FIBRILLATION ABLATION N/A 09/29/2016   Procedure: Atrial Fibrillation Ablation;  Surgeon: Regan Lemming, MD;  [facility];  Service: Cardiovascular;  Laterality: N/A;   CATARACT EXTRACTION Right 09/2017   has had infection, then oil bubble, the gas bubble 02/06/2018   COLONOSCOPY WITH PROPOFOL N/A 07/18/2022   Procedure: COLONOSCOPY WITH PROPOFOL;  Surgeon: Charlott Rakes, MD;  [facility];  Service: Gastroenterology;  Laterality: N/A;   DRUG INDUCED ENDOSCOPY N/A 10/25/2021   Procedure: DRUG INDUCED SLEEP ENDOSCOPY;  Surgeon: Osborn Coho, MD;  Location: [facility];  Service: ENT;  Laterality: N/A;   IMPLANTATION OF HYPOGLOSSAL NERVE STIMULATOR Right 02/28/2022   Procedure: IMPLANTATION OF HYPOGLOSSAL NERVE STIMULATOR;  Surgeon: Osborn Coho, MD;  Location: [facility];  Service: ENT;  Laterality: Right;   KNEE SURGERY Right 1999   arthroscopic   NM RENAL LASIX ([facility] HX) Bilateral 2007   POLYPECTOMY  07/18/2022   Procedure: POLYPECTOMY;  Surgeon: Charlott Rakes, MD;  [facility];  Service: Gastroenterology;;   TRANSANAL HEMORRHOIDAL DEARTERIALIZATION N/A 07/08/2019   Procedure: TRANSANAL  HEMORRHOIDAL DEARTERIALIZATION;  Surgeon: Romie Levee, MD;  Location: [facility] [place];  Service: General;  Laterality: N/A;    Allergies  Allergies  Allergen Reactions   Atorvastatin Other (See Comments)    Fatigue, myalgias Other reaction(s): Other (See Comments) Fatigue, myalgias    Home Medications    Prior to Admission medications   Medication Sig Start Date End Date Taking? Authorizing Provider  alfuzosin (UROXATRAL) 10 MG 24 hr tablet Take 10 mg by mouth daily. 09/11/22   [provider]  aspirin EC 81 MG tablet Take 1 tablet (81 mg total) by mouth daily. 09/18/22   Yates Decamp, MD  cholecalciferol (VITAMIN D3) 25 MCG (1000 UNIT) tablet Take 1,000 Units by mouth daily.    [provider]  diltiazem (TIAZAC) 180 MG 24 hr capsule TAKE 1 CAPSULE DAILY 09/05/22   Yates Decamp, MD  dorzolamide-timolol (COSOPT) 22.3-6.8 MG/ML ophthalmic solution Place 2 drops into the right eye daily. 07/17/21   [provider]  rosuvastatin (CRESTOR) 40 MG tablet TAKE AS INSTRUCTED BY YOUR PRESCRIBER 10/27/22   Yates Decamp, MD  vitamin E 400 UNIT capsule Take 400 Units by mouth daily.    [provider]    Physical Exam    Vital Signs:  Fred Thompson does not have vital signs available for review today.  Given telephonic nature of communication, physical exam is limited. AAOx3. NAD. Normal affect.  Speech and respirations are unlabored.  Accessory Clinical Findings    None  Assessment & Plan    1.  Preoperative Cardiovascular Risk Assessment: According to the Revised Cardiac Risk Index (RCRI), his Perioperative Risk of Major Cardiac Event is (%): 0.9. His Functional Capacity in METs is: 8.23 according to the Duke Activity Status Index (DASI). The patient is doing well from a cardiac perspective. Therefore, based on ACC/AHA guidelines, the patient would be at acceptable risk for the planned procedure without further cardiovascular testing.   The  patient was advised that if he develops new symptoms prior to surgery to contact our office to arrange for a follow-up visit, and he verbalized understanding.  Per office protocol, he may hold aspirin for 5-7 days prior to procedure and should resume as soon as hemodynamically stable postoperatively.  A copy of this note will be routed to requesting surgeon.  Time:   Today, I have spent 10 minutes with the patient with telehealth technology discussing medical history, symptoms, and management plan.    Levi Aland, NP-C  06/13/2023, 8:42 AM [address], [address] Office [phone] Fax [phone]

## 2023-06-13 ENCOUNTER — Ambulatory Visit: Attending: General Practice | Admitting: Nurse Practitioner

## 2023-06-13 DIAGNOSIS — Z01810 Encounter for preprocedural cardiovascular examination: Secondary | ICD-10-CM | POA: Diagnosis not present

## 2023-07-11 DIAGNOSIS — M1712 Unilateral primary osteoarthritis, left knee: Secondary | ICD-10-CM | POA: Diagnosis not present

## 2023-07-23 DIAGNOSIS — M21162 Varus deformity, not elsewhere classified, left knee: Secondary | ICD-10-CM | POA: Diagnosis not present

## 2023-07-23 DIAGNOSIS — M1712 Unilateral primary osteoarthritis, left knee: Secondary | ICD-10-CM | POA: Diagnosis not present

## 2023-07-27 DIAGNOSIS — M21162 Varus deformity, not elsewhere classified, left knee: Secondary | ICD-10-CM | POA: Diagnosis not present

## 2023-07-27 DIAGNOSIS — Z96652 Presence of left artificial knee joint: Secondary | ICD-10-CM | POA: Diagnosis not present

## 2023-07-27 DIAGNOSIS — M1712 Unilateral primary osteoarthritis, left knee: Secondary | ICD-10-CM | POA: Diagnosis not present

## 2023-07-30 DIAGNOSIS — M25562 Pain in left knee: Secondary | ICD-10-CM | POA: Diagnosis not present

## 2023-07-30 DIAGNOSIS — Z96652 Presence of left artificial knee joint: Secondary | ICD-10-CM | POA: Diagnosis not present

## 2023-08-01 DIAGNOSIS — Z96652 Presence of left artificial knee joint: Secondary | ICD-10-CM | POA: Diagnosis not present

## 2023-08-01 DIAGNOSIS — M25562 Pain in left knee: Secondary | ICD-10-CM | POA: Diagnosis not present

## 2023-08-06 DIAGNOSIS — Z96652 Presence of left artificial knee joint: Secondary | ICD-10-CM | POA: Diagnosis not present

## 2023-08-06 DIAGNOSIS — M25562 Pain in left knee: Secondary | ICD-10-CM | POA: Diagnosis not present

## 2023-08-07 DIAGNOSIS — M1712 Unilateral primary osteoarthritis, left knee: Secondary | ICD-10-CM | POA: Diagnosis not present

## 2023-08-09 DIAGNOSIS — Z96652 Presence of left artificial knee joint: Secondary | ICD-10-CM | POA: Diagnosis not present

## 2023-08-09 DIAGNOSIS — M25562 Pain in left knee: Secondary | ICD-10-CM | POA: Diagnosis not present

## 2023-08-16 DIAGNOSIS — M25562 Pain in left knee: Secondary | ICD-10-CM | POA: Diagnosis not present

## 2023-08-16 DIAGNOSIS — Z96652 Presence of left artificial knee joint: Secondary | ICD-10-CM | POA: Diagnosis not present

## 2023-08-17 DIAGNOSIS — M25562 Pain in left knee: Secondary | ICD-10-CM | POA: Diagnosis not present

## 2023-08-17 DIAGNOSIS — Z96652 Presence of left artificial knee joint: Secondary | ICD-10-CM | POA: Diagnosis not present

## 2023-08-20 DIAGNOSIS — Z96652 Presence of left artificial knee joint: Secondary | ICD-10-CM | POA: Diagnosis not present

## 2023-08-20 DIAGNOSIS — M25562 Pain in left knee: Secondary | ICD-10-CM | POA: Diagnosis not present

## 2023-08-22 DIAGNOSIS — Z96652 Presence of left artificial knee joint: Secondary | ICD-10-CM | POA: Diagnosis not present

## 2023-08-22 DIAGNOSIS — M25562 Pain in left knee: Secondary | ICD-10-CM | POA: Diagnosis not present

## 2023-08-27 DIAGNOSIS — Z96652 Presence of left artificial knee joint: Secondary | ICD-10-CM | POA: Diagnosis not present

## 2023-08-27 DIAGNOSIS — M25562 Pain in left knee: Secondary | ICD-10-CM | POA: Diagnosis not present

## 2023-08-29 DIAGNOSIS — Z96652 Presence of left artificial knee joint: Secondary | ICD-10-CM | POA: Diagnosis not present

## 2023-08-29 DIAGNOSIS — M25562 Pain in left knee: Secondary | ICD-10-CM | POA: Diagnosis not present

## 2023-08-31 ENCOUNTER — Other Ambulatory Visit: Payer: Self-pay | Admitting: Cardiology

## 2023-08-31 DIAGNOSIS — R002 Palpitations: Secondary | ICD-10-CM

## 2023-08-31 DIAGNOSIS — Z8679 Personal history of other diseases of the circulatory system: Secondary | ICD-10-CM

## 2023-09-04 DIAGNOSIS — Z96652 Presence of left artificial knee joint: Secondary | ICD-10-CM | POA: Diagnosis not present

## 2023-09-04 DIAGNOSIS — M25562 Pain in left knee: Secondary | ICD-10-CM | POA: Diagnosis not present

## 2023-09-06 DIAGNOSIS — Z96652 Presence of left artificial knee joint: Secondary | ICD-10-CM | POA: Diagnosis not present

## 2023-09-06 DIAGNOSIS — M25562 Pain in left knee: Secondary | ICD-10-CM | POA: Diagnosis not present

## 2023-09-10 DIAGNOSIS — M25562 Pain in left knee: Secondary | ICD-10-CM | POA: Diagnosis not present

## 2023-09-10 DIAGNOSIS — Z96652 Presence of left artificial knee joint: Secondary | ICD-10-CM | POA: Diagnosis not present

## 2023-09-13 DIAGNOSIS — M25562 Pain in left knee: Secondary | ICD-10-CM | POA: Diagnosis not present

## 2023-09-13 DIAGNOSIS — Z96652 Presence of left artificial knee joint: Secondary | ICD-10-CM | POA: Diagnosis not present

## 2023-09-17 DIAGNOSIS — M25562 Pain in left knee: Secondary | ICD-10-CM | POA: Diagnosis not present

## 2023-09-17 DIAGNOSIS — Z96652 Presence of left artificial knee joint: Secondary | ICD-10-CM | POA: Diagnosis not present

## 2023-09-19 DIAGNOSIS — M25562 Pain in left knee: Secondary | ICD-10-CM | POA: Diagnosis not present

## 2023-09-19 DIAGNOSIS — Z96652 Presence of left artificial knee joint: Secondary | ICD-10-CM | POA: Diagnosis not present

## 2023-10-10 DIAGNOSIS — D225 Melanocytic nevi of trunk: Secondary | ICD-10-CM | POA: Diagnosis not present

## 2023-10-10 DIAGNOSIS — C44612 Basal cell carcinoma of skin of right upper limb, including shoulder: Secondary | ICD-10-CM | POA: Diagnosis not present

## 2023-10-10 DIAGNOSIS — L2089 Other atopic dermatitis: Secondary | ICD-10-CM | POA: Diagnosis not present

## 2023-10-10 DIAGNOSIS — L821 Other seborrheic keratosis: Secondary | ICD-10-CM | POA: Diagnosis not present

## 2023-10-10 DIAGNOSIS — Z85828 Personal history of other malignant neoplasm of skin: Secondary | ICD-10-CM | POA: Diagnosis not present

## 2023-10-10 DIAGNOSIS — C4441 Basal cell carcinoma of skin of scalp and neck: Secondary | ICD-10-CM | POA: Diagnosis not present

## 2023-10-10 DIAGNOSIS — L570 Actinic keratosis: Secondary | ICD-10-CM | POA: Diagnosis not present

## 2023-10-12 DIAGNOSIS — N401 Enlarged prostate with lower urinary tract symptoms: Secondary | ICD-10-CM | POA: Diagnosis not present

## 2023-10-12 DIAGNOSIS — R3912 Poor urinary stream: Secondary | ICD-10-CM | POA: Diagnosis not present

## 2023-10-16 DIAGNOSIS — Z9889 Other specified postprocedural states: Secondary | ICD-10-CM | POA: Diagnosis not present

## 2023-10-16 DIAGNOSIS — H35371 Puckering of macula, right eye: Secondary | ICD-10-CM | POA: Diagnosis not present

## 2023-10-16 DIAGNOSIS — Z961 Presence of intraocular lens: Secondary | ICD-10-CM | POA: Diagnosis not present

## 2023-10-16 DIAGNOSIS — H44001 Unspecified purulent endophthalmitis, right eye: Secondary | ICD-10-CM | POA: Diagnosis not present

## 2023-10-19 DIAGNOSIS — R3912 Poor urinary stream: Secondary | ICD-10-CM | POA: Diagnosis not present

## 2023-10-19 DIAGNOSIS — N401 Enlarged prostate with lower urinary tract symptoms: Secondary | ICD-10-CM | POA: Diagnosis not present

## 2023-10-19 DIAGNOSIS — R972 Elevated prostate specific antigen [PSA]: Secondary | ICD-10-CM | POA: Diagnosis not present

## 2023-10-22 DIAGNOSIS — H6501 Acute serous otitis media, right ear: Secondary | ICD-10-CM | POA: Diagnosis not present

## 2023-11-29 ENCOUNTER — Other Ambulatory Visit: Payer: Self-pay | Admitting: Cardiology

## 2023-11-29 DIAGNOSIS — R002 Palpitations: Secondary | ICD-10-CM

## 2023-11-29 DIAGNOSIS — Z8679 Personal history of other diseases of the circulatory system: Secondary | ICD-10-CM
